# Patient Record
Sex: Female | Born: 1975
Health system: Southern US, Community
[De-identification: ages and names within clinical notes are randomized; demographics above are authoritative.]

## PROBLEM LIST (undated history)

## (undated) DIAGNOSIS — E119 Type 2 diabetes mellitus without complications: Secondary | ICD-10-CM

## (undated) DIAGNOSIS — B001 Herpesviral vesicular dermatitis: Secondary | ICD-10-CM

## (undated) DIAGNOSIS — K219 Gastro-esophageal reflux disease without esophagitis: Secondary | ICD-10-CM

## (undated) DIAGNOSIS — I10 Essential (primary) hypertension: Secondary | ICD-10-CM

## (undated) DIAGNOSIS — L719 Rosacea, unspecified: Secondary | ICD-10-CM

## (undated) HISTORY — DX: Type 2 diabetes mellitus without complications: E11.9

## (undated) HISTORY — DX: Rosacea, unspecified: L71.9

## (undated) HISTORY — DX: Herpesviral vesicular dermatitis: B00.1

## (undated) HISTORY — PX: WISDOM TOOTH EXTRACTION: SHX21

---

## 2002-09-02 ENCOUNTER — Emergency Department (HOSPITAL_COMMUNITY): Admission: EM | Admit: 2002-09-02 | Discharge: 2002-09-02 | Payer: Self-pay | Admitting: Emergency Medicine

## 2008-03-20 ENCOUNTER — Emergency Department (HOSPITAL_COMMUNITY): Admission: EM | Admit: 2008-03-20 | Discharge: 2008-03-20 | Payer: Self-pay | Admitting: Family Medicine

## 2009-03-12 ENCOUNTER — Emergency Department (HOSPITAL_COMMUNITY): Admission: EM | Admit: 2009-03-12 | Discharge: 2009-03-12 | Payer: Self-pay | Admitting: Family Medicine

## 2009-09-17 ENCOUNTER — Emergency Department (HOSPITAL_COMMUNITY): Admission: EM | Admit: 2009-09-17 | Discharge: 2009-09-17 | Payer: Self-pay | Admitting: Physician Assistant

## 2011-11-19 ENCOUNTER — Encounter (HOSPITAL_COMMUNITY): Payer: Self-pay | Admitting: *Deleted

## 2011-11-19 ENCOUNTER — Emergency Department (HOSPITAL_COMMUNITY)
Admission: EM | Admit: 2011-11-19 | Discharge: 2011-11-19 | Disposition: A | Discharge status: Home / Self Care | Payer: 59 | Attending: Emergency Medicine | Admitting: Emergency Medicine

## 2011-11-19 ENCOUNTER — Emergency Department (HOSPITAL_COMMUNITY): Payer: 59

## 2011-11-19 DIAGNOSIS — I10 Essential (primary) hypertension: Secondary | ICD-10-CM | POA: Insufficient documentation

## 2011-11-19 DIAGNOSIS — N8000 Endometriosis of the uterus, unspecified: Secondary | ICD-10-CM | POA: Insufficient documentation

## 2011-11-19 DIAGNOSIS — R109 Unspecified abdominal pain: Secondary | ICD-10-CM

## 2011-11-19 DIAGNOSIS — N859 Noninflammatory disorder of uterus, unspecified: Secondary | ICD-10-CM | POA: Insufficient documentation

## 2011-11-19 DIAGNOSIS — R51 Headache: Secondary | ICD-10-CM | POA: Insufficient documentation

## 2011-11-19 DIAGNOSIS — R55 Syncope and collapse: Secondary | ICD-10-CM | POA: Insufficient documentation

## 2011-11-19 DIAGNOSIS — R1031 Right lower quadrant pain: Secondary | ICD-10-CM | POA: Insufficient documentation

## 2011-11-19 DIAGNOSIS — N898 Other specified noninflammatory disorders of vagina: Secondary | ICD-10-CM | POA: Insufficient documentation

## 2011-11-19 DIAGNOSIS — N858 Other specified noninflammatory disorders of uterus: Secondary | ICD-10-CM

## 2011-11-19 HISTORY — DX: Essential (primary) hypertension: I10

## 2011-11-19 LAB — URINE MICROSCOPIC-ADD ON

## 2011-11-19 LAB — CBC WITH DIFFERENTIAL/PLATELET
Basophils Absolute: 0 10*3/uL (ref 0.0–0.1)
Basophils Relative: 1 % (ref 0–1)
Eosinophils Absolute: 0.1 10*3/uL (ref 0.0–0.7)
Eosinophils Relative: 2 % (ref 0–5)
HCT: 34.8 % — ABNORMAL LOW (ref 36.0–46.0)
Hemoglobin: 11.8 g/dL — ABNORMAL LOW (ref 12.0–15.0)
Lymphocytes Relative: 31 % (ref 12–46)
Lymphs Abs: 1.9 10*3/uL (ref 0.7–4.0)
MCH: 29 pg (ref 26.0–34.0)
MCHC: 33.9 g/dL (ref 30.0–36.0)
MCV: 85.5 fL (ref 78.0–100.0)
Monocytes Absolute: 0.4 10*3/uL (ref 0.1–1.0)
Monocytes Relative: 7 % (ref 3–12)
Neutro Abs: 3.5 10*3/uL (ref 1.7–7.7)
Neutrophils Relative %: 59 % (ref 43–77)
Platelets: 207 10*3/uL (ref 150–400)
RBC: 4.07 MIL/uL (ref 3.87–5.11)
RDW: 13.8 % (ref 11.5–15.5)
WBC: 6 10*3/uL (ref 4.0–10.5)

## 2011-11-19 LAB — URINALYSIS, ROUTINE W REFLEX MICROSCOPIC
Glucose, UA: NEGATIVE mg/dL
Hgb urine dipstick: NEGATIVE
Ketones, ur: 15 mg/dL — AB
Nitrite: NEGATIVE
Protein, ur: 30 mg/dL — AB
Specific Gravity, Urine: 1.031 — ABNORMAL HIGH (ref 1.005–1.030)
Urobilinogen, UA: 1 mg/dL (ref 0.0–1.0)
pH: 7 (ref 5.0–8.0)

## 2011-11-19 LAB — BASIC METABOLIC PANEL
BUN: 14 mg/dL (ref 6–23)
CO2: 25 mEq/L (ref 19–32)
Calcium: 9.4 mg/dL (ref 8.4–10.5)
Chloride: 98 mEq/L (ref 96–112)
Creatinine, Ser: 0.69 mg/dL (ref 0.50–1.10)
GFR calc Af Amer: >90 mL/min (ref >90)
GFR calc non Af Amer: >90 mL/min (ref >90)
Glucose, Bld: 124 mg/dL — ABNORMAL HIGH (ref 70–99)
Potassium: 3.5 mEq/L (ref 3.5–5.1)
Sodium: 135 mEq/L (ref 135–145)

## 2011-11-19 LAB — WET PREP, GENITAL
Clue Cells Wet Prep HPF POC: NONE SEEN
Trich, Wet Prep: NONE SEEN
Yeast Wet Prep HPF POC: NONE SEEN

## 2011-11-19 LAB — HCG, QUANTITATIVE, PREGNANCY: hCG, Beta Chain, Quant, S: 2 m[IU]/mL (ref <5)

## 2011-11-19 LAB — PREGNANCY, URINE: Preg Test, Ur: NEGATIVE

## 2011-11-19 MED ORDER — HYDROCODONE-ACETAMINOPHEN 5-325 MG PO TABS: 1.0000 | ORAL_TABLET | ORAL | Status: AC | PRN

## 2011-11-19 MED ORDER — ONDANSETRON HCL 4 MG/2ML IJ SOLN
4.0000 mg | Freq: Once | INTRAMUSCULAR | Status: AC
  Administered 2011-11-19: 4 mg via INTRAVENOUS
  Filled 2011-11-19: qty 2

## 2011-11-19 MED ORDER — MORPHINE SULFATE 4 MG/ML IJ SOLN
4.0000 mg | Freq: Once | INTRAMUSCULAR | Status: AC
  Administered 2011-11-19: 4 mg via INTRAVENOUS
  Filled 2011-11-19: qty 1

## 2011-11-19 MED ORDER — ONDANSETRON HCL 4 MG PO TABS: 4.0000 mg | ORAL_TABLET | Freq: Three times a day (TID) | ORAL | Status: AC | PRN

## 2011-11-19 NOTE — ED Notes (Signed)
Patient transported to Ultrasound 

## 2011-11-19 NOTE — ED Provider Notes (Signed)
Medical screening examination/treatment/procedure(s) were performed by non-physician practitioner and as supervising physician I was immediately available for consultation/collaboration.   Richardean Canal, MD 11/19/11 1556

## 2011-11-19 NOTE — ED Notes (Addendum)
C/o intermittent RLQ pain radiating through to low back since 0630. Pain worse with mvmt. +nausea, no emesis. Denies diarrhea, urinary frequency, dysuria. States felt faint, dizzy, lightheaded prior to syncopal episode

## 2011-11-19 NOTE — ED Notes (Signed)
C/o sudden onset intermittent RLQ pain since 0630. While here at work had syncopal episode

## 2011-11-19 NOTE — ED Provider Notes (Signed)
History     CSN: [id number]  Arrival date & time 11/19/11  0741   First MD Initiated Contact with Patient 11/19/11 [phone]      Chief Complaint  Patient presents with  . Near Syncope  . Abdominal Pain    (Consider location/radiation/quality/duration/timing/severity/associated sxs/prior treatment) HPI Comments: Patient reports she was getting out of her car to come in to work and upon listing her right leg developed acute onset RLQ abdominal pain, sharp in nature, radiates to the back.  Pain has been constant since onset but also "coming in waves", is worse with movement, better with laying still.  Has taken aleve which she thinks helped a little.  States she was walking into work and started getting lightheaded, her visual field turned white and she started to collapse, caught by a co-worker.  Denies LOC or hitting head.  States she also had pressure in her right calf about the same time.  Denies fevers, N/V, urinary symptoms, change in bowel habits, abnormal vaginal discharge or bleeding.  Denies CP, SOB, palpitations, leg swelling.  LMP Aug 19.  No hx abdominal surgeries.  Pt has never had a blood clot before, no recent hx surgeries, immobilization, travel.  Pt does not smoke, not on any exogenous estrogen.  Mother has had at least two blood clots, one of which was post-surgical, the other pt is not sure of but denies any known clotting disorders.    Patient is a 36 y.o. female presenting with abdominal pain. The history is provided by the patient.  Abdominal Pain The primary symptoms of the illness include abdominal pain. The primary symptoms of the illness do not include fever, nausea, vomiting, diarrhea, dysuria, vaginal discharge or vaginal bleeding.  Additional symptoms associated with the illness include back pain. Symptoms associated with the illness do not include chills, constipation, urgency or frequency.    Past Medical History  Diagnosis Date  . Hypertension     History  reviewed. No pertinent past surgical history.  No family history on file.  History  Substance Use Topics  . Smoking status: Never Smoker   . Smokeless tobacco: Not on file  . Alcohol Use: No    OB History    Grav Para Term Preterm Abortions TAB SAB Ect Mult Living                  Review of Systems  Constitutional: Negative for fever and chills.  Gastrointestinal: Positive for abdominal pain. Negative for nausea, vomiting, diarrhea, constipation and blood in stool.  Genitourinary: Negative for dysuria, urgency, frequency, vaginal bleeding, vaginal discharge and menstrual problem.  Musculoskeletal: Positive for back pain.  Neurological: Positive for headaches. Negative for syncope.    Allergies  Penicillins  Home Medications   Current Outpatient Rx  Name Route Sig Dispense Refill  . NAPROXEN SODIUM 220 MG PO TABS Oral Take 220 mg by mouth once.    Marland Kitchen VALSARTAN-HYDROCHLOROTHIAZIDE 320-25 MG PO TABS Oral Take 1 tablet by mouth daily.      BP 126/85  Pulse 102  Temp 97.7 F (36.5 C) (Oral)  Resp 16  Ht 5\' 8"  (1.727 m)  Wt 201 lb (91.173 kg)  BMI 30.56 kg/m2  SpO2 100%  LMP 10/28/2011  Physical Exam  Nursing note and vitals reviewed. Constitutional: She appears well-developed and well-nourished. No distress.  HENT:  Head: Normocephalic and atraumatic.  Neck: Neck supple.  Cardiovascular: Normal rate and regular rhythm.   Pulmonary/Chest: Effort normal and breath sounds normal. No  respiratory distress. She has no wheezes. She has no rales.  Abdominal: Soft. She exhibits no distension. There is tenderness in the right lower quadrant. There is no rebound, no guarding and no CVA tenderness.  Genitourinary: Uterus is tender. Cervix exhibits no motion tenderness. Right adnexum displays tenderness. Right adnexum displays no mass. Left adnexum displays no mass and no tenderness. No erythema, tenderness or bleeding around the vagina. No foreign body around the vagina. Vaginal  discharge found.  Musculoskeletal:       Cervical back: She exhibits no bony tenderness.       Thoracic back: She exhibits no bony tenderness.       Lumbar back: She exhibits no bony tenderness.  Neurological: She is alert.  Skin: She is not diaphoretic.    ED Course  Procedures (including critical care time)  Labs Reviewed  URINALYSIS, ROUTINE W REFLEX MICROSCOPIC - Abnormal; Notable for the following:    APPearance CLOUDY (*)     Specific Gravity, Urine 1.031 (*)     Bilirubin Urine SMALL (*)     Ketones, ur 15 (*)     Protein, ur 30 (*)     Leukocytes, UA MODERATE (*)     All other components within normal limits  CBC WITH DIFFERENTIAL - Abnormal; Notable for the following:    Hemoglobin 11.8 (*)     HCT 34.8 (*)     All other components within normal limits  BASIC METABOLIC PANEL - Abnormal; Notable for the following:    Glucose, Bld 124 (*)     All other components within normal limits  WET PREP, GENITAL - Abnormal; Notable for the following:    WBC, Wet Prep HPF POC MODERATE (*)     All other components within normal limits  URINE MICROSCOPIC-ADD ON - Abnormal; Notable for the following:    Squamous Epithelial / LPF MANY (*)     Bacteria, UA MANY (*)     Casts GRANULAR CAST (*)  HYALINE CASTS   All other components within normal limits  PREGNANCY, URINE  HCG, QUANTITATIVE, PREGNANCY  GC/CHLAMYDIA PROBE AMP, GENITAL   US Transvaginal Non-ob  11/19/2011  *RADIOLOGY REPORT*  Clinical data:  Abrupt onset of right lower quadrant pain. Evaluate for torsion.  LMP 10/31/2011  TRANSABDOMINAL AND TRANSVAGINAL ULTRASOUND OF PELVIS DOPPLER ULTRASOUND OF OVARIES  Technique:  Both transabdominal and transvaginal ultrasound examinations of the pelvis were performed. Transabdominal technique was performed for global imaging of the pelvis including uterus, ovaries, adnexal regions, and pelvic cul-de-sac.  It was necessary to proceed with endovaginal exam following the transabdominal exam  to visualize the myometrium, endometrium and adnexa.  Color and duplex Doppler ultrasound was utilized to evaluate blood flow to the ovaries.  Comparison:  None.  Findings:  Uterus:  Has a sagittal length of 10.1 cm, depth of 5.6 cm and width of 5.9 cm.  The myometrium appears diffusely heterogeneous with some focal thickening of the anterior myometrium with posterior acoustical shadowing suspicious for underlying adenomyosis.  In the posterior upper uterine segment portion of the uterus there is a small irregular cystic focus identified measuring 7 x 5 x 10 mm. On provided images, it is difficult to tell whether this is immediately subendometrial or is within the endometrial canal. On the cine loop provided, I would favor this to be subendometrial and likely an indication of underlying adenomyosis. Correlation with beta HCG would be recommended to exclude an early intrauterine gestational sac.  Endometrium:  A poor endometrial  myometrial interface is seen and resolution in the fundal portion of the endometrium makes accurate measurement suboptimal.  Right ovary: Measures 3.5 x 2.3 x 2.4 cm and contains a corpus luteum demonstrating peripheral flow.  Color flow evaluation reveals intraovarian flow  Left ovary: Has a normal appearance measuring 2.7 x 2.0 x 2.8 cm Normal appearance/no adnexal mass.  Color flow evaluation reveals intraovarian flow  Pulsed Doppler evaluation demonstrates normal low-resistance arterial and venous waveforms in both ovaries.  IMPRESSION: Heterogeneous myometrium with anterior myometrial shadowing and findings suspicious for a subendometrial cyst and underlying adenomyosis.  As delineation of the fundal portion of the endometrial canal and delineation between the endometrium and subendometrial zones is difficult on this exam, correlation with beta HCG is recommended to exclude an early intrauterine gestational sac causing the small irregular cystic appearance in the fundal portion of the  uterus.  Normal ovaries with no sonographic evidence for ovarian torsion.   Original Report Authenticated By: Bertha Stakes, M.D.    US Pelvis Complete  11/19/2011  *RADIOLOGY REPORT*  Clinical data:  Abrupt onset of right lower quadrant pain. Evaluate for torsion.  LMP 10/31/2011  TRANSABDOMINAL AND TRANSVAGINAL ULTRASOUND OF PELVIS DOPPLER ULTRASOUND OF OVARIES  Technique:  Both transabdominal and transvaginal ultrasound examinations of the pelvis were performed. Transabdominal technique was performed for global imaging of the pelvis including uterus, ovaries, adnexal regions, and pelvic cul-de-sac.  It was necessary to proceed with endovaginal exam following the transabdominal exam to visualize the myometrium, endometrium and adnexa.  Color and duplex Doppler ultrasound was utilized to evaluate blood flow to the ovaries.  Comparison:  None.  Findings:  Uterus:  Has a sagittal length of 10.1 cm, depth of 5.6 cm and width of 5.9 cm.  The myometrium appears diffusely heterogeneous with some focal thickening of the anterior myometrium with posterior acoustical shadowing suspicious for underlying adenomyosis.  In the posterior upper uterine segment portion of the uterus there is a small irregular cystic focus identified measuring 7 x 5 x 10 mm. On provided images, it is difficult to tell whether this is immediately subendometrial or is within the endometrial canal. On the cine loop provided, I would favor this to be subendometrial and likely an indication of underlying adenomyosis. Correlation with beta HCG would be recommended to exclude an early intrauterine gestational sac.  Endometrium:  A poor endometrial myometrial interface is seen and resolution in the fundal portion of the endometrium makes accurate measurement suboptimal.  Right ovary: Measures 3.5 x 2.3 x 2.4 cm and contains a corpus luteum demonstrating peripheral flow.  Color flow evaluation reveals intraovarian flow  Left ovary: Has a normal  appearance measuring 2.7 x 2.0 x 2.8 cm Normal appearance/no adnexal mass.  Color flow evaluation reveals intraovarian flow  Pulsed Doppler evaluation demonstrates normal low-resistance arterial and venous waveforms in both ovaries.  IMPRESSION: Heterogeneous myometrium with anterior myometrial shadowing and findings suspicious for a subendometrial cyst and underlying adenomyosis.  As delineation of the fundal portion of the endometrial canal and delineation between the endometrium and subendometrial zones is difficult on this exam, correlation with beta HCG is recommended to exclude an early intrauterine gestational sac causing the small irregular cystic appearance in the fundal portion of the uterus.  Normal ovaries with no sonographic evidence for ovarian torsion.   Original Report Authenticated By: Bertha Stakes, M.D.    [place] Art/ven Flow Abd Pelv Doppler  11/19/2011  *RADIOLOGY REPORT*  Clinical data:  Abrupt onset of right lower  quadrant pain. Evaluate for torsion.  LMP 10/31/2011  TRANSABDOMINAL AND TRANSVAGINAL ULTRASOUND OF PELVIS DOPPLER ULTRASOUND OF OVARIES  Technique:  Both transabdominal and transvaginal ultrasound examinations of the pelvis were performed. Transabdominal technique was performed for global imaging of the pelvis including uterus, ovaries, adnexal regions, and pelvic cul-de-sac.  It was necessary to proceed with endovaginal exam following the transabdominal exam to visualize the myometrium, endometrium and adnexa.  Color and duplex Doppler ultrasound was utilized to evaluate blood flow to the ovaries.  Comparison:  None.  Findings:  Uterus:  Has a sagittal length of 10.1 cm, depth of 5.6 cm and width of 5.9 cm.  The myometrium appears diffusely heterogeneous with some focal thickening of the anterior myometrium with posterior acoustical shadowing suspicious for underlying adenomyosis.  In the posterior upper uterine segment portion of the uterus there is a small irregular cystic  focus identified measuring 7 x 5 x 10 mm. On provided images, it is difficult to tell whether this is immediately subendometrial or is within the endometrial canal. On the cine loop provided, I would favor this to be subendometrial and likely an indication of underlying adenomyosis. Correlation with beta HCG would be recommended to exclude an early intrauterine gestational sac.  Endometrium:  A poor endometrial myometrial interface is seen and resolution in the fundal portion of the endometrium makes accurate measurement suboptimal.  Right ovary: Measures 3.5 x 2.3 x 2.4 cm and contains a corpus luteum demonstrating peripheral flow.  Color flow evaluation reveals intraovarian flow  Left ovary: Has a normal appearance measuring 2.7 x 2.0 x 2.8 cm Normal appearance/no adnexal mass.  Color flow evaluation reveals intraovarian flow  Pulsed Doppler evaluation demonstrates normal low-resistance arterial and venous waveforms in both ovaries.  IMPRESSION: Heterogeneous myometrium with anterior myometrial shadowing and findings suspicious for a subendometrial cyst and underlying adenomyosis.  As delineation of the fundal portion of the endometrial canal and delineation between the endometrium and subendometrial zones is difficult on this exam, correlation with beta HCG is recommended to exclude an early intrauterine gestational sac causing the small irregular cystic appearance in the fundal portion of the uterus.  Normal ovaries with no sonographic evidence for ovarian torsion.   Original Report Authenticated By: Bertha Stakes, M.D.     9:18 AM Discussed patient with Dr Silverio Lay.    11:55 AM Reexamined patient's abdomen.  Abdomen is soft, only mildly ttp RLQ, no guarding, no rebound. I discussed results with Dr Silverio Lay who has also reviewed the [place] and lab results.  Plan is for HCG quant to ensure pregnancy test is negative.  If negative, pt to be d/c home.    1:38 PM HCG quant was ordered, cancelled by someone, and  reordered by me.  I have been in contact with the lab.  I have also just asked the nurse to ensure that this has made it to the lab.    2:03 PM Patient reports her pain is only 1/10.  Discussed results with patient and significant other.  Plan is for d/c.     Date: 11/19/2011  Rate: 95  Rhythm: normal sinus rhythm  QRS Axis: normal  Intervals: normal  ST/T Wave abnormalities: normal  Conduction Disutrbances: none  Narrative Interpretation:   Old EKG Reviewed: none available   1. Abdominal pain   2. Adenomyosis   3. Uterine cyst       MDM  Pt with acute onset RLQ pain.  Pt with midline and right adnexal tenderness on pelvic  -  US shows "Heterogeneous myometrium with anterior myometrial shadowing and findings suspicious for a subendometrial cyst and underlying adenomyosis"  - HCG quant is negative.  Ovaries are normal with normal doppler flow.  After pain and nausea medications, patient became much more comfortable, did not require maintenance dosing.  Repeat exam is improved, nonsurgical.  Labs show only mild anemia.  Urine sample with many epithelial cells, only 3-6 WBC.  Given acute onset, doubt UTI as cause of pain.  Also given story, lack of fever or elevation in WBC, doubt appendicitis.  Pt did have pain as she raised her leg to get out of car, possibly musculoskeletal, but I believe the pain was more severe than this - however, it was worse with movement.  Pt noted mild tenderness of right calf though patient is very low risk for DVT and has no exam findings consistent with DVT.  Pt d/c home with norco and zofran, though patient's symptoms were relieved in ED - pt strongly advised to return for any worsening symptoms and not attempt to "cover it up" with pain medication.  Discussed all results with patient.  Pt given return precautions.  Pt verbalizes understanding and agrees with plan.           [place], [place] 11/19/11 1520

## 2011-11-19 NOTE — ED Notes (Signed)
Lab states that they received pts HCG quantitative  at 1354.

## 2011-11-20 LAB — GC/CHLAMYDIA PROBE AMP, GENITAL
Chlamydia, DNA Probe: NEGATIVE
GC Probe Amp, Genital: NEGATIVE

## 2012-02-12 ENCOUNTER — Encounter (HOSPITAL_COMMUNITY): Payer: Self-pay | Admitting: Pharmacist

## 2012-02-15 NOTE — H&P (Signed)
Jaime Rhodes  DICTATION # [id number] CSN# [id number]   Meriel Pica, MD 02/15/2012 9:45 AM

## 2012-02-16 ENCOUNTER — Encounter (HOSPITAL_COMMUNITY)
Admission: RE | Admit: 2012-02-16 | Discharge: 2012-02-16 | Disposition: A | Discharge status: Home / Self Care | Payer: 59 | Source: Ambulatory Visit | Attending: Obstetrics and Gynecology | Admitting: Obstetrics and Gynecology

## 2012-02-16 ENCOUNTER — Encounter (HOSPITAL_COMMUNITY): Payer: Self-pay

## 2012-02-16 HISTORY — DX: Gastro-esophageal reflux disease without esophagitis: K21.9

## 2012-02-16 LAB — CBC
HCT: 36.9 % (ref 36.0–46.0)
Hemoglobin: 12.2 g/dL (ref 12.0–15.0)
MCH: 28.2 pg (ref 26.0–34.0)
MCHC: 33.1 g/dL (ref 30.0–36.0)
MCV: 85.2 fL (ref 78.0–100.0)
Platelets: 229 10*3/uL (ref 150–400)
RBC: 4.33 MIL/uL (ref 3.87–5.11)
RDW: 13.6 % (ref 11.5–15.5)
WBC: 7.7 10*3/uL (ref 4.0–10.5)

## 2012-02-16 LAB — BASIC METABOLIC PANEL
BUN: 12 mg/dL (ref 6–23)
CO2: 26 mEq/L (ref 19–32)
Calcium: 9.4 mg/dL (ref 8.4–10.5)
Chloride: 99 mEq/L (ref 96–112)
Creatinine, Ser: 0.71 mg/dL (ref 0.50–1.10)
GFR calc Af Amer: >90 mL/min (ref >90)
GFR calc non Af Amer: >90 mL/min (ref >90)
Glucose, Bld: 123 mg/dL — ABNORMAL HIGH (ref 70–99)
Potassium: 3.6 mEq/L (ref 3.5–5.1)
Sodium: 137 mEq/L (ref 135–145)

## 2012-02-16 LAB — SURGICAL PCR SCREEN
MRSA, PCR: NEGATIVE
Staphylococcus aureus: NEGATIVE

## 2012-02-16 NOTE — Patient Instructions (Addendum)
   Your procedure is scheduled ZO:[id number] December 10th  Enter through the Main Entrance of [facility] at:6am Pick up the phone at the desk and dial [phone] and inform us of your arrival.  Please call this number if you have any problems the morning of surgery: [phone]  Remember: Do not eat or drink anything after midnight on Monday Please take your blood pressure medicine and nexium morning of surgery with sips of water  Do not wear jewelry, make-up, or FINGER nail polish No metal in your hair or on your body. Do not wear lotions, powders, perfumes. You may wear deodorant.  Please use your CHG wash as directed prior to surgery.  Do not shave anywhere for at least 12 hours prior to first CHG shower.  Do not bring valuables to the hospital.   Leave suitcase in the car. After Surgery it may be brought to your room. For patients being admitted to the hospital, checkout time is 11:00am the day of discharge.

## 2012-02-16 NOTE — H&P (Signed)
NAMEELAJAH, Jaime Rhodes              ACCOUNT NO.:  [id number]  MEDICAL RECORD NO.:  [id number]  LOCATION:                                 FACILITY:  PHYSICIAN:  Duke Salvia. Marcelle Overlie, M.D.DATE OF BIRTH:  1975-05-01  DATE OF ADMISSION: DATE OF DISCHARGE:                             HISTORY & PHYSICAL   CHIEF COMPLAINT:  Pelvic pain, dyspareunia, menorrhagia.  HISTORY OF PRESENT ILLNESS:  A 36 year old, G2, P2, her husband has had a vasectomy.  She has had two vaginal deliveries, she had been evaluated by her PCP, [name] for pelvic pain, dyspareunia and menorrhagia.  Ultrasound by [name] on November 19, 2011, showed heterogeneous myometrium with anterior myometrial shattering, possible underlying adenomyosis.  Adnexa negative.  Adnexa were unremarkable on that evaluation.  Her last Pap was October 2012, which was normal. Hemoglobin 11.1.  Due to the severity of her menorrhagia and pain, she would prefer more definitive therapy.  We had discussed other options perhaps hysteroscopy, D and C, Mirena IUD or ablation, but because of her cramping and the menorrhagia, she prefers LAVH.  This procedure including risks related to bleeding, infection, transfusion, adjacent to organ injury, possible need to complete the surgery by open technique along with her expected recovery time discussed with her, which she understands and accepts.  PAST MEDICAL HISTORY:  ALLERGIES:  PENICILLIN.  OBSTETRICAL HISTORY:  Two vaginal deliveries in 1995 and 2001.  REVIEW OF SYSTEMS:  Significant for prior history of migraine headache, hypertension.  CURRENT MEDICATIONS:  Valsartan 320/25 once daily.  FAMILY HISTORY:  Significant for heart disease, asthma, kidney disease, UTI, osteoporosis, arthritis, diabetes, hypertension, and breast, prostate and stomach cancer.  SOCIAL HISTORY:  Denies alcohol, tobacco, or drug use.  [name] is her PCP.  PHYSICAL EXAMINATION:  VITAL SIGNS:   Temperature 98.2, blood pressure 132/100. HEENT:  Unremarkable. NECK:  Supple without masses. LUNGS:  Clear. CARDIOVASCULAR:  Regular rate and rhythm without murmurs, rubs, or gallops. BREASTS:  Without masses. ABDOMEN:  Soft, flat, and nontender. PELVIC:  Normal external genitalia.  Vagina and cervix are clear. Uterus is 7-8-week size, midposition, mobile, adnexa negative. EXTREMITIES:  Unremarkable. NEUROLOGIC:  Unremarkable.  IMPRESSION:  Menorrhagia with dyspareunia and pelvic pain, possible adenomyosis.  PLAN:  LAVH.  Procedure and risks discussed as above.     Jaime Rhodes M. Marcelle Overlie, M.D.     [facility]/MEDQ  D:  02/15/2012  T:  02/15/2012  Job:  [id number]

## 2012-02-21 MED ORDER — GENTAMICIN SULFATE 40 MG/ML IJ SOLN
INTRAVENOUS | Status: AC
  Administered 2012-02-22: 100 mL via INTRAVENOUS
  Filled 2012-02-21: qty 9.5

## 2012-02-22 ENCOUNTER — Encounter (HOSPITAL_COMMUNITY): Payer: Self-pay | Admitting: *Deleted

## 2012-02-22 ENCOUNTER — Encounter (HOSPITAL_COMMUNITY)
Admission: RE | Disposition: A | Discharge status: Home / Self Care | Payer: Self-pay | Source: Ambulatory Visit | Attending: Obstetrics and Gynecology

## 2012-02-22 ENCOUNTER — Ambulatory Visit (HOSPITAL_COMMUNITY): Payer: 59 | Admitting: Anesthesiology

## 2012-02-22 ENCOUNTER — Encounter (HOSPITAL_COMMUNITY): Payer: Self-pay | Admitting: Anesthesiology

## 2012-02-22 ENCOUNTER — Ambulatory Visit (HOSPITAL_COMMUNITY)
Admission: RE | Admit: 2012-02-22 | Discharge: 2012-02-23 | Disposition: A | Discharge status: Home / Self Care | Payer: 59 | Source: Ambulatory Visit | Attending: Obstetrics and Gynecology | Admitting: Obstetrics and Gynecology

## 2012-02-22 DIAGNOSIS — IMO0002 Reserved for concepts with insufficient information to code with codable children: Secondary | ICD-10-CM | POA: Insufficient documentation

## 2012-02-22 DIAGNOSIS — N949 Unspecified condition associated with female genital organs and menstrual cycle: Secondary | ICD-10-CM | POA: Insufficient documentation

## 2012-02-22 DIAGNOSIS — N8000 Endometriosis of the uterus, unspecified: Secondary | ICD-10-CM | POA: Insufficient documentation

## 2012-02-22 HISTORY — PX: LAPAROSCOPIC ASSISTED VAGINAL HYSTERECTOMY: SHX5398

## 2012-02-22 LAB — PREGNANCY, URINE: Preg Test, Ur: NEGATIVE

## 2012-02-22 SURGERY — HYSTERECTOMY, VAGINAL, LAPAROSCOPY-ASSISTED
Anesthesia: General | Site: Abdomen | Wound class: Clean Contaminated

## 2012-02-22 MED ORDER — HYDROMORPHONE HCL PF 1 MG/ML IJ SOLN
INTRAMUSCULAR | Status: AC
  Administered 2012-02-22: 0.5 mg via INTRAVENOUS
  Filled 2012-02-22: qty 1

## 2012-02-22 MED ORDER — IBUPROFEN 800 MG PO TABS
800.0000 mg | ORAL_TABLET | Freq: Three times a day (TID) | ORAL | Status: DC | PRN
  Administered 2012-02-23: 800 mg via ORAL
  Filled 2012-02-22: qty 1

## 2012-02-22 MED ORDER — KETOROLAC TROMETHAMINE 30 MG/ML IJ SOLN
INTRAMUSCULAR | Status: AC
  Filled 2012-02-22: qty 1

## 2012-02-22 MED ORDER — ONDANSETRON HCL 4 MG/2ML IJ SOLN
INTRAMUSCULAR | Status: AC
  Filled 2012-02-22: qty 2

## 2012-02-22 MED ORDER — VALSARTAN-HYDROCHLOROTHIAZIDE 320-25 MG PO TABS: 1.0000 | ORAL_TABLET | Freq: Every day | ORAL | Status: DC

## 2012-02-22 MED ORDER — LIDOCAINE HCL (CARDIAC) 20 MG/ML IV SOLN
INTRAVENOUS | Status: AC
  Filled 2012-02-22: qty 5

## 2012-02-22 MED ORDER — LACTATED RINGERS IV SOLN
INTRAVENOUS | Status: DC
  Administered 2012-02-22 (×3): via INTRAVENOUS

## 2012-02-22 MED ORDER — KETOROLAC TROMETHAMINE 30 MG/ML IJ SOLN
INTRAMUSCULAR | Status: DC | PRN
Start: 2012-02-22 — End: 2012-02-22
  Administered 2012-02-22: 60 mg via INTRAVENOUS

## 2012-02-22 MED ORDER — NEOSTIGMINE METHYLSULFATE 1 MG/ML IJ SOLN
INTRAMUSCULAR | Status: AC
  Filled 2012-02-22: qty 10

## 2012-02-22 MED ORDER — NEOSTIGMINE METHYLSULFATE 1 MG/ML IJ SOLN
INTRAMUSCULAR | Status: DC | PRN
  Administered 2012-02-22: 2 mg via INTRAVENOUS

## 2012-02-22 MED ORDER — BUPIVACAINE HCL (PF) 0.25 % IJ SOLN
INTRAMUSCULAR | Status: AC
  Filled 2012-02-22: qty 30

## 2012-02-22 MED ORDER — ZOLPIDEM TARTRATE 5 MG PO TABS: 5.0000 mg | ORAL_TABLET | Freq: Every evening | ORAL | Status: DC | PRN

## 2012-02-22 MED ORDER — HYDROCHLOROTHIAZIDE 25 MG PO TABS
25.0000 mg | ORAL_TABLET | Freq: Every day | ORAL | Status: DC
  Administered 2012-02-23: 25 mg via ORAL
  Filled 2012-02-22 (×2): qty 1

## 2012-02-22 MED ORDER — DIPHENHYDRAMINE HCL 12.5 MG/5ML PO ELIX: 12.5000 mg | ORAL_SOLUTION | Freq: Four times a day (QID) | ORAL | Status: DC | PRN

## 2012-02-22 MED ORDER — GLYCOPYRROLATE 0.2 MG/ML IJ SOLN
INTRAMUSCULAR | Status: DC | PRN
  Administered 2012-02-22: 0.4 mg via INTRAVENOUS

## 2012-02-22 MED ORDER — PANTOPRAZOLE SODIUM 40 MG PO TBEC
80.0000 mg | DELAYED_RELEASE_TABLET | Freq: Every day | ORAL | Status: DC
  Filled 2012-02-22 (×2): qty 2

## 2012-02-22 MED ORDER — BUPIVACAINE HCL (PF) 0.25 % IJ SOLN
INTRAMUSCULAR | Status: DC | PRN
  Administered 2012-02-22: 9 mL

## 2012-02-22 MED ORDER — SODIUM CHLORIDE 0.9 % IJ SOLN: 9.0000 mL | INTRAMUSCULAR | Status: DC | PRN

## 2012-02-22 MED ORDER — KETOROLAC TROMETHAMINE 30 MG/ML IJ SOLN
30.0000 mg | Freq: Four times a day (QID) | INTRAMUSCULAR | Status: DC
  Filled 2012-02-22: qty 1

## 2012-02-22 MED ORDER — PROPOFOL 10 MG/ML IV EMUL
INTRAVENOUS | Status: DC | PRN
  Administered 2012-02-22: 200 mg via INTRAVENOUS

## 2012-02-22 MED ORDER — PHENYLEPHRINE HCL 10 MG/ML IJ SOLN
INTRAMUSCULAR | Status: DC | PRN
  Administered 2012-02-22 (×2): 40 ug via INTRAVENOUS
  Administered 2012-02-22: 80 ug via INTRAVENOUS

## 2012-02-22 MED ORDER — IRBESARTAN 300 MG PO TABS
300.0000 mg | ORAL_TABLET | Freq: Every day | ORAL | Status: DC
  Administered 2012-02-23: 300 mg via ORAL
  Filled 2012-02-22 (×2): qty 1

## 2012-02-22 MED ORDER — FENTANYL CITRATE 0.05 MG/ML IJ SOLN
INTRAMUSCULAR | Status: DC | PRN
  Administered 2012-02-22: 50 ug via INTRAVENOUS
  Administered 2012-02-22: 100 ug via INTRAVENOUS

## 2012-02-22 MED ORDER — ONDANSETRON HCL 4 MG/2ML IJ SOLN: 4.0000 mg | Freq: Four times a day (QID) | INTRAMUSCULAR | Status: DC | PRN

## 2012-02-22 MED ORDER — DEXAMETHASONE SODIUM PHOSPHATE 4 MG/ML IJ SOLN
INTRAMUSCULAR | Status: DC | PRN
  Administered 2012-02-22: 10 mg via INTRAVENOUS

## 2012-02-22 MED ORDER — HYDROMORPHONE HCL PF 1 MG/ML IJ SOLN
0.2500 mg | INTRAMUSCULAR | Status: DC | PRN
  Administered 2012-02-22 (×2): 0.5 mg via INTRAVENOUS

## 2012-02-22 MED ORDER — NALOXONE HCL 0.4 MG/ML IJ SOLN: 0.4000 mg | INTRAMUSCULAR | Status: DC | PRN

## 2012-02-22 MED ORDER — SCOPOLAMINE 1 MG/3DAYS TD PT72
MEDICATED_PATCH | TRANSDERMAL | Status: AC
  Administered 2012-02-22: 1.5 mg
  Filled 2012-02-22: qty 1

## 2012-02-22 MED ORDER — BUTORPHANOL TARTRATE 1 MG/ML IJ SOLN: 1.0000 mg | INTRAMUSCULAR | Status: DC | PRN

## 2012-02-22 MED ORDER — EPHEDRINE SULFATE 50 MG/ML IJ SOLN
INTRAMUSCULAR | Status: DC | PRN
  Administered 2012-02-22 (×2): 5 mg via INTRAVENOUS

## 2012-02-22 MED ORDER — DIPHENHYDRAMINE HCL 50 MG/ML IJ SOLN: 12.5000 mg | Freq: Four times a day (QID) | INTRAMUSCULAR | Status: DC | PRN

## 2012-02-22 MED ORDER — FENTANYL CITRATE 0.05 MG/ML IJ SOLN
INTRAMUSCULAR | Status: AC
  Filled 2012-02-22: qty 5

## 2012-02-22 MED ORDER — MIDAZOLAM HCL 5 MG/5ML IJ SOLN
INTRAMUSCULAR | Status: DC | PRN
  Administered 2012-02-22: 2 mg via INTRAVENOUS

## 2012-02-22 MED ORDER — SODIUM CHLORIDE 0.9 % IJ SOLN
INTRAMUSCULAR | Status: DC | PRN
  Administered 2012-02-22: 10 mL

## 2012-02-22 MED ORDER — KETOROLAC TROMETHAMINE 30 MG/ML IJ SOLN
30.0000 mg | Freq: Four times a day (QID) | INTRAMUSCULAR | Status: DC
  Administered 2012-02-22 – 2012-02-23 (×4): 30 mg via INTRAVENOUS
  Filled 2012-02-22 (×3): qty 1

## 2012-02-22 MED ORDER — PROPOFOL 10 MG/ML IV EMUL
INTRAVENOUS | Status: AC
  Filled 2012-02-22: qty 20

## 2012-02-22 MED ORDER — GLYCOPYRROLATE 0.2 MG/ML IJ SOLN
INTRAMUSCULAR | Status: AC
  Filled 2012-02-22: qty 2

## 2012-02-22 MED ORDER — LIDOCAINE HCL (CARDIAC) 20 MG/ML IV SOLN
INTRAVENOUS | Status: DC | PRN
  Administered 2012-02-22: 80 mg via INTRAVENOUS

## 2012-02-22 MED ORDER — DEXAMETHASONE SODIUM PHOSPHATE 10 MG/ML IJ SOLN
INTRAMUSCULAR | Status: AC
  Filled 2012-02-22: qty 1

## 2012-02-22 MED ORDER — OXYCODONE-ACETAMINOPHEN 5-325 MG PO TABS
1.0000 | ORAL_TABLET | ORAL | Status: DC | PRN
  Administered 2012-02-23: 1 via ORAL
  Filled 2012-02-22: qty 1

## 2012-02-22 MED ORDER — ONDANSETRON HCL 4 MG PO TABS: 4.0000 mg | ORAL_TABLET | Freq: Four times a day (QID) | ORAL | Status: DC | PRN

## 2012-02-22 MED ORDER — KETOROLAC TROMETHAMINE 30 MG/ML IJ SOLN: 30.0000 mg | Freq: Once | INTRAMUSCULAR | Status: DC

## 2012-02-22 MED ORDER — ROCURONIUM BROMIDE 100 MG/10ML IV SOLN
INTRAVENOUS | Status: DC | PRN
  Administered 2012-02-22: 50 mg via INTRAVENOUS

## 2012-02-22 MED ORDER — ONDANSETRON HCL 4 MG/2ML IJ SOLN
INTRAMUSCULAR | Status: DC | PRN
  Administered 2012-02-22: 4 mg via INTRAVENOUS

## 2012-02-22 MED ORDER — ROCURONIUM BROMIDE 50 MG/5ML IV SOLN
INTRAVENOUS | Status: AC
  Filled 2012-02-22: qty 1

## 2012-02-22 MED ORDER — MIDAZOLAM HCL 2 MG/2ML IJ SOLN
INTRAMUSCULAR | Status: AC
  Filled 2012-02-22: qty 2

## 2012-02-22 MED ORDER — MORPHINE SULFATE (PF) 1 MG/ML IV SOLN
INTRAVENOUS | Status: DC
  Administered 2012-02-22: 10:00:00 via INTRAVENOUS
  Administered 2012-02-22: 5 mg via INTRAVENOUS
  Administered 2012-02-22: 4 mg via INTRAVENOUS
  Administered 2012-02-23: 1 mg via INTRAVENOUS
  Administered 2012-02-23: 2 mg via INTRAVENOUS
  Filled 2012-02-22: qty 25

## 2012-02-22 MED ORDER — DEXTROSE IN LACTATED RINGERS 5 % IV SOLN
INTRAVENOUS | Status: DC
  Administered 2012-02-22 – 2012-02-23 (×3): via INTRAVENOUS

## 2012-02-22 MED ORDER — MENTHOL 3 MG MT LOZG: 1.0000 | LOZENGE | OROMUCOSAL | Status: DC | PRN

## 2012-02-22 SURGICAL SUPPLY — 33 items
CATH ROBINSON RED A/P 16FR (CATHETERS) ×2 IMPLANT
CLOTH BEACON ORANGE TIMEOUT ST (SAFETY) ×2 IMPLANT
CONT PATH 16OZ SNAP LID 3702 (MISCELLANEOUS) ×2 IMPLANT
COVER TABLE BACK 60X90 (DRAPES) ×2 IMPLANT
DECANTER SPIKE VIAL GLASS SM (MISCELLANEOUS) ×2 IMPLANT
DERMABOND ADVANCED (GAUZE/BANDAGES/DRESSINGS) ×1
DERMABOND ADVANCED .7 DNX12 (GAUZE/BANDAGES/DRESSINGS) ×1 IMPLANT
ELECT LIGASURE LONG (ELECTRODE) ×2 IMPLANT
ELECT REM PT RETURN 9FT ADLT (ELECTROSURGICAL) ×2
ELECTRODE REM PT RTRN 9FT ADLT (ELECTROSURGICAL) ×1 IMPLANT
GLOVE BIO SURGEON STRL SZ7 (GLOVE) ×6 IMPLANT
GLOVE BIOGEL PI IND STRL 6.5 (GLOVE) ×1 IMPLANT
GLOVE BIOGEL PI INDICATOR 6.5 (GLOVE) ×1
GOWN BRE IMP SLV AUR LG STRL (GOWN DISPOSABLE) ×4 IMPLANT
GOWN STRL REIN XL XLG (GOWN DISPOSABLE) ×4 IMPLANT
NEEDLE INSUFFLATION 14GA 120MM (NEEDLE) ×2 IMPLANT
NS IRRIG 1000ML POUR BTL (IV SOLUTION) ×2 IMPLANT
PACK LAVH (CUSTOM PROCEDURE TRAY) ×2 IMPLANT
PROTECTOR NERVE ULNAR (MISCELLANEOUS) ×2 IMPLANT
SEALER TISSUE G2 CVD JAW 45CM (ENDOMECHANICALS) ×4 IMPLANT
SET IRRIG TUBING LAPAROSCOPIC (IRRIGATION / IRRIGATOR) IMPLANT
SUT MON AB 2-0 CT1 36 (SUTURE) ×4 IMPLANT
SUT VIC AB 0 CT1 18XCR BRD8 (SUTURE) ×1 IMPLANT
SUT VIC AB 0 CT1 36 (SUTURE) ×2 IMPLANT
SUT VIC AB 0 CT1 8-18 (SUTURE) ×1
SUT VICRYL 0 TIES 12 18 (SUTURE) ×2 IMPLANT
SUT VICRYL 4-0 PS2 18IN ABS (SUTURE) ×2 IMPLANT
TOWEL OR 17X24 6PK STRL BLUE (TOWEL DISPOSABLE) ×6 IMPLANT
TRAY FOLEY CATH 14FR (SET/KITS/TRAYS/PACK) ×2 IMPLANT
TROCAR Z-THREAD BLADED 11X100M (TROCAR) ×2 IMPLANT
TROCAR Z-THREAD BLADED 5X100MM (TROCAR) ×2 IMPLANT
WARMER LAPAROSCOPE (MISCELLANEOUS) ×2 IMPLANT
WATER STERILE IRR 1000ML POUR (IV SOLUTION) ×2 IMPLANT

## 2012-02-22 NOTE — Addendum Note (Signed)
Addendum  created 02/22/12 1802 by Algis Greenhouse, CRNA   Modules edited:Notes Section

## 2012-02-22 NOTE — Op Note (Signed)
Preoperative diagnosis: Pelvic pain dyspareunia, probable adenomyosis  Postoperative diagnosis: Same  Procedure: LAVH  Surgeon: Marcelle Overlie  Assistant: Tomblin  EBL: 150 cc  Specimens removed: Uterus, to pathology  Procedure and findings:  Patient taken the operating room after an adequate level of general anesthesia was obtained with the legs in stirrups the abdomen perineum and vagina were prepped and draped in usual fashion for LAVH. The bladder was drained EUA carried out the uterus was upper limit of normal size mobile adnexa negative. Appropriate timeout for taken at that point. Weighted speculum was positioned and a Hulka was applied to the uterus for manipulation.  Attention directed to the abdomen the subumbilical area was infiltrated with quarter percent Marcaine plain small incision was made in the varies needle was introduced without difficulty. Its intra-abdominal position was verified by pressure water testing. After a 3 L pneumoperitoneum syncopated lap scopic trocar and sleeve were then introduced that difficulty. There was no evidence of any bleeding or trauma. 3 finger breaths above the symphysis in the midline a 5 mm trocar was inserted under direct visualization. The patient was then placed in Trendelenburg and the pelvic findings as follows  Uterus itself was symmetrically large 6-7 weeks size globular at the fundus adnexa unremarkable cul-de-sac free and clear the appendix and upper abdomen were normal using the in seal device the utero-ovarian pedicle on the right was coagulated and divided down to and including the round ligament the same on the opposite side thus conserving both ovaries. These areas were hemostatic attention directed to the vaginal portion the procedure at this point.  Legs were extended weighted speculum was positioned cervix grasped with tenaculum the cervical vaginal mucosa was incised posterior culdotomy performed without difficulty the bladder was  advanced superiorly with sharp and blunt dissection the anterior peritoneal reflection was then identified entered, and a retractor used to gently elevate the bladder out of the field. In sequential manner, staying close to the uterus, the LigaSure device was then used to coagulated and divided the uterosacral ligament cardinal ligament uterine vasculature pedicles and upper broad ligament pedicles. The fundus of the uterus is in delivered posteriorly remaining pedicles were coagulated and divided. The vaginal cuff was then closed from 3 to 9:00 with a running locked 2-0 Vicryl suture. Prior to closure sponge denies precast reported as correct x2 vaginal mucosa was then closed right to left with interrupted 2-0 Monocryl sutures this was hemostatic Foley catheter was in position draining clear urine. Repeat laparoscopy revealed hemostasis of the operative sites even at reduced pressure and stress removed gas less escape because closed with 4-0 Vicryl subcuticular at the umbilicus and Dermabond in the lower incision she tolerated this well went to recovery room in good condition.  Dictated with [facility]  Telesia Ates M. Milana Obey.D.

## 2012-02-22 NOTE — Preoperative (Signed)
Beta Blockers   Reason not to administer Beta Blockers:Not Applicable 

## 2012-02-22 NOTE — Anesthesia Postprocedure Evaluation (Signed)
Anesthesia Post Note  Patient: Jaime Rhodes  Procedure(s) Performed: Procedure(s) (LRB): LAPAROSCOPIC ASSISTED VAGINAL HYSTERECTOMY (N/A)  Anesthesia type: General  Patient location: PACU  Post pain: Pain level controlled  Post assessment: Post-op Vital signs reviewed  Last Vitals:  Filed Vitals:   02/22/12 1000  BP: 131/79  Pulse: 107  Temp:   Resp: 12    Post vital signs: Reviewed  Level of consciousness: sedated  Complications: No apparent anesthesia complications

## 2012-02-22 NOTE — Anesthesia Postprocedure Evaluation (Signed)
  Anesthesia Post-op Note  Patient: Jaime Rhodes  Procedure(s) Performed: Procedure(s) (LRB) with comments: LAPAROSCOPIC ASSISTED VAGINAL HYSTERECTOMY (N/A)  Patient Location: [facility]  Anesthesia Type:General  Level of Consciousness: sedated  Airway and Oxygen Therapy: Patient Spontanous Breathing and Patient connected to nasal cannula oxygen  Post-op Pain: mild  Post-op Assessment: Post-op Vital signs reviewed  Post-op Vital Signs: Reviewed and stable  Complications: No apparent anesthesia complications

## 2012-02-22 NOTE — Progress Notes (Signed)
The patient was re-examined with no change in status 

## 2012-02-22 NOTE — Anesthesia Preprocedure Evaluation (Addendum)
Anesthesia Evaluation  Patient identified by MRN, date of birth, ID band Patient awake    Reviewed: Allergy & Precautions, H&P , NPO status , Patient's Chart, lab work & pertinent test results  Airway Mallampati: III TM Distance: >3 FB Neck ROM: Full    Dental No notable dental hx. (+) Teeth Intact and Caps   Pulmonary neg pulmonary ROS,  breath sounds clear to auscultation  Pulmonary exam normal       Cardiovascular hypertension, Pt. on medications Rhythm:Regular Rate:Normal     Neuro/Psych negative neurological ROS  negative psych ROS   GI/Hepatic Neg liver ROS, GERD-  Medicated and Controlled,  Endo/Other  negative endocrine ROS  Renal/GU negative Renal ROS  negative genitourinary   Musculoskeletal negative musculoskeletal ROS (+)   Abdominal (+) + obese,  Abdomen: soft.    Peds  Hematology negative hematology ROS (+)   Anesthesia Other Findings   Reproductive/Obstetrics negative OB ROS Pelvic Pain Menorrhagia                          Anesthesia Physical Anesthesia Plan  ASA: II  Anesthesia Plan: General   Post-op Pain Management:    Induction: Intravenous  Airway Management Planned: Oral ETT  Additional Equipment:   Intra-op Plan:   Post-operative Plan: Extubation in OR  Informed Consent: I have reviewed the patients History and Physical, chart, labs and discussed the procedure including the risks, benefits and alternatives for the proposed anesthesia with the patient or authorized representative who has indicated his/her understanding and acceptance.   Dental advisory given  Plan Discussed with: CRNA, Anesthesiologist and Surgeon  Anesthesia Plan Comments:         Anesthesia Quick Evaluation

## 2012-02-22 NOTE — Transfer of Care (Signed)
Immediate Anesthesia Transfer of Care Note  Patient: Jaime Rhodes  Procedure(s) Performed: Procedure(s) (LRB) with comments: LAPAROSCOPIC ASSISTED VAGINAL HYSTERECTOMY (N/A)  Patient Location: PACU  Anesthesia Type:General  Level of Consciousness: awake, oriented and patient cooperative  Airway & Oxygen Therapy: Patient Spontanous Breathing and Patient connected to nasal cannula oxygen  Post-op Assessment: Report given to PACU RN and Post -op Vital signs reviewed and stable  Post vital signs: Reviewed and stable  Complications: No apparent anesthesia complications

## 2012-02-23 LAB — CBC
HCT: 29.3 % — ABNORMAL LOW (ref 36.0–46.0)
Hemoglobin: 9.5 g/dL — ABNORMAL LOW (ref 12.0–15.0)
MCH: 28.2 pg (ref 26.0–34.0)
MCHC: 32.4 g/dL (ref 30.0–36.0)
MCV: 86.9 fL (ref 78.0–100.0)
Platelets: 189 10*3/uL (ref 150–400)
RBC: 3.37 MIL/uL — ABNORMAL LOW (ref 3.87–5.11)
RDW: 13.7 % (ref 11.5–15.5)
WBC: 9.8 10*3/uL (ref 4.0–10.5)

## 2012-02-23 MED ORDER — IBUPROFEN 800 MG PO TABS: 800.0000 mg | ORAL_TABLET | Freq: Three times a day (TID) | ORAL | Status: DC | PRN

## 2012-02-23 MED ORDER — OXYCODONE-ACETAMINOPHEN 5-325 MG PO TABS: 1.0000 | ORAL_TABLET | ORAL | Status: DC | PRN

## 2012-02-23 NOTE — Discharge Summary (Signed)
Physician Discharge Summary  Patient ID: Jaime Rhodes MRN: [id number] DOB/AGE: [birth year] 36 y.o.  Admit date: 02/22/2012 Discharge date: 02/23/2012  Admission Diagnoses:  Discharge Diagnoses:  Active Problems:  * No active hospital problems. *    Discharged Condition: good  Hospital Course: LAVH, D/C on POD # 1  Consults: none  Significant Diagnostic Studies: labs:  CBC    Component Value Date/Time   WBC 9.8 02/23/2012 0535   RBC 3.37* 02/23/2012 0535   HGB 9.5* 02/23/2012 0535   HCT 29.3* 02/23/2012 0535   PLT 189 02/23/2012 0535   MCV 86.9 02/23/2012 0535   MCH 28.2 02/23/2012 0535   MCHC 32.4 02/23/2012 0535   RDW 13.7 02/23/2012 0535   LYMPHSABS 1.9 11/19/2011 0821   MONOABS 0.4 11/19/2011 0821   EOSABS 0.1 11/19/2011 0821   BASOSABS 0.0 11/19/2011 0821      Treatments: surgery: LAVH  Discharge Exam: Blood pressure 115/76, pulse 85, temperature 98.7 F (37.1 C), temperature source Oral, resp. rate 16, height 5\' 8"  (1.727 m), weight 212 lb (96.163 kg), SpO2 100.00%. abd soft +BS, Incs C/D  Disposition: 01-Home or Self Care     Medication List     As of 02/23/2012  8:32 AM    STOP taking these medications         naproxen sodium 220 MG tablet   Commonly known as: ANAPROX      TAKE these medications         esomeprazole 40 MG capsule   Commonly known as: NEXIUM   Take 40 mg by mouth daily as needed. For reflux      ibuprofen 800 MG tablet   Commonly known as: ADVIL,MOTRIN   Take 1 tablet (800 mg total) by mouth every 8 (eight) hours as needed (mild pain).      oxyCODONE-acetaminophen 5-325 MG per tablet   Commonly known as: PERCOCET/ROXICET   Take 1-2 tablets by mouth every 4 (four) hours as needed (moderate to severe pain (when tolerating fluids)).      valACYclovir 500 MG tablet   Commonly known as: VALTREX   Take 500 mg by mouth daily as needed. For fever blister      valsartan-hydrochlorothiazide 320-25 MG per tablet   Commonly known  as: DIOVAN-HCT   Take 1 tablet by mouth daily.           Follow-up Information    Follow up with Meriel Pica, MD. Schedule an appointment as soon as possible for a visit in 7 days.   Contact information:   [address] [place] [address] [phone]          Signed: Meriel Pica 02/23/2012, 8:32 AM

## 2012-02-23 NOTE — Progress Notes (Signed)
1 Day Post-Op Procedure(s) (LRB): LAPAROSCOPIC ASSISTED VAGINAL HYSTERECTOMY (N/A)  Subjective: Patient reports tolerating PO.    Objective: I have reviewed patient's vital signs and labs. BP 115/76  Pulse 85  Temp 98.7 F (37.1 C) (Oral)  Resp 16  Ht 5\' 8"  (1.727 Rhodes)  Wt 212 lb (96.163 kg)  BMI 32.23 kg/m2  SpO2 100% CBC    Component Value Date/Time   WBC 9.8 02/23/2012 0535   RBC 3.37* 02/23/2012 0535   HGB 9.5* 02/23/2012 0535   HCT 29.3* 02/23/2012 0535   PLT 189 02/23/2012 0535   MCV 86.9 02/23/2012 0535   MCH 28.2 02/23/2012 0535   MCHC 32.4 02/23/2012 0535   RDW 13.7 02/23/2012 0535   LYMPHSABS 1.9 11/19/2011 0821   MONOABS 0.4 11/19/2011 0821   EOSABS 0.1 11/19/2011 0821   BASOSABS 0.0 11/19/2011 0821     abd + BS, soft, incs c/d  Assessment: s/p Procedure(s) (LRB) with comments: LAPAROSCOPIC ASSISTED VAGINAL HYSTERECTOMY (N/A): stable  Plan: Discharge home  LOS: 1 day    Jaime Rhodes 02/23/2012, 8:29 AM

## 2012-02-25 ENCOUNTER — Encounter (HOSPITAL_COMMUNITY): Payer: Self-pay | Admitting: Obstetrics and Gynecology

## 2012-05-24 ENCOUNTER — Other Ambulatory Visit: Payer: Self-pay | Admitting: Obstetrics and Gynecology

## 2012-05-24 DIAGNOSIS — R928 Other abnormal and inconclusive findings on diagnostic imaging of breast: Secondary | ICD-10-CM

## 2012-05-30 ENCOUNTER — Ambulatory Visit
Admission: RE | Admit: 2012-05-30 | Discharge: 2012-05-30 | Disposition: A | Discharge status: Home / Self Care | Payer: 59 | Source: Ambulatory Visit | Attending: Obstetrics and Gynecology | Admitting: Obstetrics and Gynecology

## 2012-05-30 DIAGNOSIS — R928 Other abnormal and inconclusive findings on diagnostic imaging of breast: Secondary | ICD-10-CM

## 2012-11-30 ENCOUNTER — Encounter (HOSPITAL_COMMUNITY): Payer: Self-pay | Admitting: Emergency Medicine

## 2012-11-30 ENCOUNTER — Emergency Department (HOSPITAL_COMMUNITY)
Admission: EM | Admit: 2012-11-30 | Discharge: 2012-11-30 | Disposition: A | Discharge status: Home / Self Care | Payer: 59 | Source: Home / Self Care | Attending: Family Medicine | Admitting: Family Medicine

## 2012-11-30 DIAGNOSIS — L739 Follicular disorder, unspecified: Secondary | ICD-10-CM

## 2012-11-30 DIAGNOSIS — L738 Other specified follicular disorders: Secondary | ICD-10-CM

## 2012-11-30 DIAGNOSIS — L678 Other hair color and hair shaft abnormalities: Secondary | ICD-10-CM

## 2012-11-30 MED ORDER — DOXYCYCLINE HYCLATE 100 MG PO CAPS: 100.0000 mg | ORAL_CAPSULE | Freq: Two times a day (BID) | ORAL | Status: DC

## 2012-11-30 NOTE — ED Provider Notes (Signed)
Jaime Rhodes is a 37 y.o. female who presents to Urgent Care today for painful papule left inner thigh.  Patient noted a papule on her left inner thigh yesterday. This worsened and developed surrounding erythema induration. It is tender. She's tried some rubbing alcohol which has not helped. She denies any radiating pain weakness numbness fevers or chills. She feels well otherwise. No nausea vomiting or diarrhea. No actual bug bite that she can recall.   Past Medical History  Diagnosis Date  . Hypertension   . GERD (gastroesophageal reflux disease)     treated prn with nexium-will take dos   History  Substance Use Topics  . Smoking status: Never Smoker   . Smokeless tobacco: Not on file  . Alcohol Use: No   ROS as above Medications reviewed. No current facility-administered medications for this encounter.   Current Outpatient Prescriptions  Medication Sig Dispense Refill  . esomeprazole (NEXIUM) 40 MG capsule Take 40 mg by mouth daily as needed. For reflux      . ibuprofen (ADVIL,MOTRIN) 800 MG tablet Take 1 tablet (800 mg total) by mouth every 8 (eight) hours as needed (mild pain).  30 tablet  2  . valACYclovir (VALTREX) 500 MG tablet Take 500 mg by mouth daily as needed. For fever blister      . valsartan-hydrochlorothiazide (DIOVAN-HCT) 320-25 MG per tablet Take 1 tablet by mouth daily.      Marland Kitchen doxycycline (VIBRAMYCIN) 100 MG capsule Take 1 capsule (100 mg total) by mouth 2 (two) times daily.  20 capsule  0    Exam:  BP 127/87  Pulse 92  Temp(Src) 98.3 F (36.8 C) (Oral)  Resp 16  SpO2 98%  LMP 01/23/2012 Gen: Well NAD  skin : Small erythematous papule left inner thigh with 2 cm of surrounding erythema. Some induration present no fluctuance present. Tender to touch   No results found for this or any previous visit (from the past 24 hour(s)). No results found.  Assessment and Plan: 37 y.o. female with  folliculitis of the inner thigh. No evidence of abscess yet. Plan to  treat empirically with doxycycline for 7-10 days. Use over-the-counter pain medications as needed  Discussed warning signs or symptoms. Please see discharge instructions. Patient expresses understanding.      Rodolph Bong, MD 11/30/12 [phone]

## 2012-11-30 NOTE — ED Notes (Signed)
C/o infection/bite on inner left thigh.  Patient says she noticed it yesterday which was red and small looking like a pimple.  Patient says now it is swelling and uncomfortable.  Alcohol was used as treatment.

## 2014-08-20 ENCOUNTER — Other Ambulatory Visit: Payer: Self-pay | Admitting: Obstetrics and Gynecology

## 2014-08-21 LAB — CYTOLOGY - PAP

## 2015-02-25 ENCOUNTER — Telehealth: Payer: 59 | Admitting: Family

## 2015-02-25 DIAGNOSIS — J329 Chronic sinusitis, unspecified: Secondary | ICD-10-CM

## 2015-02-25 DIAGNOSIS — A499 Bacterial infection, unspecified: Secondary | ICD-10-CM

## 2015-02-25 DIAGNOSIS — B9689 Other specified bacterial agents as the cause of diseases classified elsewhere: Secondary | ICD-10-CM

## 2015-02-25 MED ORDER — LEVOFLOXACIN 500 MG PO TABS: 500.0000 mg | ORAL_TABLET | Freq: Every day | ORAL | Status: DC

## 2015-02-25 MED ORDER — BENZONATATE 100 MG PO CAPS: 100.0000 mg | ORAL_CAPSULE | Freq: Three times a day (TID) | ORAL | Status: DC | PRN

## 2015-02-25 NOTE — Progress Notes (Signed)
We are sorry that you are not feeling well.  Here is how we plan to help!  Based on what you have shared with me it looks like you have sinusitis.  Sinusitis is inflammation and infection in the sinus cavities of the head.  Based on your presentation I believe you most likely have Acute Bacterial Sinusitis.  This is an infection caused by bacteria and is treated with antibiotics. Due to your penicillin allergy, I have prescribed Levaquin 500mg , 1 tab daily x 7 days. You may use an oral decongestant such as Mucinex D or if you have glaucoma or high blood pressure use plain Mucinex. Saline nasal spray help and can safely be used as often as needed for congestion.  If you develop worsening sinus pain, fever or notice severe headache and vision changes, or if symptoms are not better after completion of antibiotic, please schedule an appointment with a health care provider.    I have also prescribed Tessalon Perles 100-200mg  (1-2 caps) every 8 hours as needed for your cough so that you can sleep better.   Sinus infections are not as easily transmitted as other respiratory infection, however we still recommend that you avoid close contact with loved ones, especially the very young and elderly.  Remember to wash your hands thoroughly throughout the day as this is the number one way to prevent the spread of infection!  Home Care:  Only take medications as instructed by your medical team.  Complete the entire course of an antibiotic.  Do not take these medications with alcohol.  A steam or ultrasonic humidifier can help congestion.  You can place a towel over your head and breathe in the steam from hot water coming from a faucet.  Avoid close contacts especially the very young and the elderly.  Cover your mouth when you cough or sneeze.  Always remember to wash your hands.  Get Help Right Away If:  You develop worsening fever or sinus pain.  You develop a severe head ache or visual  changes.  Your symptoms persist after you have completed your treatment plan.  Make sure you  Understand these instructions.  Will watch your condition.  Will get help right away if you are not doing well or get worse.  Your e-visit answers were reviewed by a board certified advanced clinical practitioner to complete your personal care plan.  Depending on the condition, your plan could have included both over the counter or prescription medications.  If there is a problem please reply  once you have received a response from your provider.  Your safety is important to [place].  If you have drug allergies check your prescription carefully.    You can use MyChart to ask questions about today's visit, request a non-urgent call back, or ask for a work or school excuse for 24 hours related to this e-Visit. If it has been greater than 24 hours you will need to follow up with your provider, or enter a new e-Visit to address those concerns.  You will get an e-mail in the next two days asking about your experience.  I hope that your e-visit has been valuable and will speed your recovery. Thank you for using e-visits.

## 2015-03-20 ENCOUNTER — Telehealth: Payer: 59 | Admitting: Physician Assistant

## 2015-03-20 DIAGNOSIS — J019 Acute sinusitis, unspecified: Secondary | ICD-10-CM

## 2015-03-20 DIAGNOSIS — B9689 Other specified bacterial agents as the cause of diseases classified elsewhere: Secondary | ICD-10-CM

## 2015-03-20 MED ORDER — DOXYCYCLINE HYCLATE 100 MG PO CAPS: 100.0000 mg | ORAL_CAPSULE | Freq: Two times a day (BID) | ORAL | Status: DC

## 2015-03-20 MED FILL — DOXYCYCLINE HYCLATE 100 MG: 100 | 10 days supply | Qty: 20 | Fill #0

## 2015-03-20 NOTE — Progress Notes (Signed)

## 2015-04-30 DIAGNOSIS — I10 Essential (primary) hypertension: Secondary | ICD-10-CM | POA: Diagnosis not present

## 2015-04-30 DIAGNOSIS — E785 Hyperlipidemia, unspecified: Secondary | ICD-10-CM | POA: Diagnosis not present

## 2015-04-30 DIAGNOSIS — Z6835 Body mass index (BMI) 35.0-35.9, adult: Secondary | ICD-10-CM | POA: Diagnosis not present

## 2015-04-30 DIAGNOSIS — K219 Gastro-esophageal reflux disease without esophagitis: Secondary | ICD-10-CM | POA: Diagnosis not present

## 2015-04-30 MED FILL — VALSARTAN-HCTZ 320-25 MG TA: 320-25 | 90 days supply | Qty: 90 | Fill #0

## 2015-09-18 DIAGNOSIS — Z01419 Encounter for gynecological examination (general) (routine) without abnormal findings: Secondary | ICD-10-CM | POA: Diagnosis not present

## 2015-09-18 DIAGNOSIS — Z6836 Body mass index (BMI) 36.0-36.9, adult: Secondary | ICD-10-CM | POA: Diagnosis not present

## 2015-09-29 MED FILL — VALSARTAN-HCTZ 320-25 MG TA: 320-25 | 90 days supply | Qty: 90 | Fill #1

## 2015-10-02 DIAGNOSIS — Z1231 Encounter for screening mammogram for malignant neoplasm of breast: Secondary | ICD-10-CM | POA: Diagnosis not present

## 2015-11-06 DIAGNOSIS — I10 Essential (primary) hypertension: Secondary | ICD-10-CM | POA: Diagnosis not present

## 2015-11-06 DIAGNOSIS — G8929 Other chronic pain: Secondary | ICD-10-CM | POA: Diagnosis not present

## 2015-11-06 DIAGNOSIS — M25562 Pain in left knee: Secondary | ICD-10-CM | POA: Diagnosis not present

## 2015-11-06 DIAGNOSIS — E785 Hyperlipidemia, unspecified: Secondary | ICD-10-CM | POA: Diagnosis not present

## 2015-11-19 DIAGNOSIS — S86912A Strain of unspecified muscle(s) and tendon(s) at lower leg level, left leg, initial encounter: Secondary | ICD-10-CM | POA: Diagnosis not present

## 2015-11-19 MED FILL — MELOXICAM 15 MG TABLET: 15 | 30 days supply | Qty: 30 | Fill #0

## 2015-12-16 DIAGNOSIS — I10 Essential (primary) hypertension: Secondary | ICD-10-CM | POA: Diagnosis not present

## 2015-12-17 DIAGNOSIS — S86912D Strain of unspecified muscle(s) and tendon(s) at lower leg level, left leg, subsequent encounter: Secondary | ICD-10-CM | POA: Diagnosis not present

## 2015-12-18 DIAGNOSIS — M25562 Pain in left knee: Secondary | ICD-10-CM | POA: Diagnosis not present

## 2016-01-27 MED FILL — VALSARTAN-HCTZ 320-25 MG TA: 320-25 | 90 days supply | Qty: 90 | Fill #2

## 2016-03-25 ENCOUNTER — Telehealth: Payer: 59 | Admitting: Family

## 2016-03-25 DIAGNOSIS — J019 Acute sinusitis, unspecified: Secondary | ICD-10-CM

## 2016-03-25 MED ORDER — FLUTICASONE PROPIONATE 50 MCG/ACT NA SUSP: 2.0000 | Freq: Every day | NASAL | 6 refills | Status: DC

## 2016-03-25 MED ORDER — AZELASTINE HCL 0.1 % NA SOLN: 2.0000 | Freq: Two times a day (BID) | NASAL | 0 refills | Status: DC

## 2016-03-25 NOTE — Addendum Note (Signed)
Addended by: Raiford Noble on: 03/25/2016 08:17 PM   Modules accepted: Orders

## 2016-03-25 NOTE — Progress Notes (Signed)

## 2016-05-10 DIAGNOSIS — M7542 Impingement syndrome of left shoulder: Secondary | ICD-10-CM | POA: Diagnosis not present

## 2016-05-10 MED FILL — MELOXICAM 15 MG TABLET: 15 | 30 days supply | Qty: 30 | Fill #0

## 2016-06-09 DIAGNOSIS — M7542 Impingement syndrome of left shoulder: Secondary | ICD-10-CM | POA: Diagnosis not present

## 2016-06-09 MED FILL — MELOXICAM 15 MG TABLET: 15 | 30 days supply | Qty: 30 | Fill #0

## 2016-06-10 DIAGNOSIS — E785 Hyperlipidemia, unspecified: Secondary | ICD-10-CM | POA: Diagnosis not present

## 2016-06-10 DIAGNOSIS — J0100 Acute maxillary sinusitis, unspecified: Secondary | ICD-10-CM | POA: Diagnosis not present

## 2016-06-10 DIAGNOSIS — K219 Gastro-esophageal reflux disease without esophagitis: Secondary | ICD-10-CM | POA: Diagnosis not present

## 2016-06-10 DIAGNOSIS — I10 Essential (primary) hypertension: Secondary | ICD-10-CM | POA: Diagnosis not present

## 2016-06-10 MED FILL — AZITHROMYCIN 250 MG TABLET: 250 | 5 days supply | Qty: 6 | Fill #0

## 2016-06-10 MED FILL — VALACYCLOVIR HCL 500 MG TAB: 500 | 10 days supply | Qty: 20 | Fill #0

## 2016-06-10 MED FILL — VALSARTAN-HCTZ 320-25 MG TA: 320-25 | 90 days supply | Qty: 90 | Fill #0

## 2016-07-22 DIAGNOSIS — M25512 Pain in left shoulder: Secondary | ICD-10-CM | POA: Diagnosis not present

## 2016-07-28 DIAGNOSIS — M7502 Adhesive capsulitis of left shoulder: Secondary | ICD-10-CM | POA: Diagnosis not present

## 2016-09-16 MED FILL — MELOXICAM 15 MG TABLET: 15 | 30 days supply | Qty: 30 | Fill #1

## 2016-09-21 MED FILL — PANTOPRAZOLE SOD DR 40 MG T: 40 | 90 days supply | Qty: 90 | Fill #0

## 2016-11-17 MED FILL — MELOXICAM 15 MG TABLET: 15 | 30 days supply | Qty: 30 | Fill #1

## 2016-11-29 MED FILL — VALSARTAN-HCTZ 320-25 MG TA: 320-25 | 90 days supply | Qty: 90 | Fill #1

## 2016-12-06 DIAGNOSIS — M25512 Pain in left shoulder: Secondary | ICD-10-CM | POA: Diagnosis not present

## 2016-12-06 DIAGNOSIS — M542 Cervicalgia: Secondary | ICD-10-CM | POA: Diagnosis not present

## 2016-12-06 MED FILL — METHYLPREDNISOLONE 4 MG TAB: 4 | 6 days supply | Qty: 21 | Fill #0

## 2016-12-23 DIAGNOSIS — Z130 Encounter for screening for diseases of the blood and blood-forming organs and certain disorders involving the immune mechanism: Secondary | ICD-10-CM | POA: Diagnosis not present

## 2016-12-23 DIAGNOSIS — R739 Hyperglycemia, unspecified: Secondary | ICD-10-CM | POA: Diagnosis not present

## 2016-12-23 DIAGNOSIS — E785 Hyperlipidemia, unspecified: Secondary | ICD-10-CM | POA: Diagnosis not present

## 2016-12-23 DIAGNOSIS — J0100 Acute maxillary sinusitis, unspecified: Secondary | ICD-10-CM | POA: Diagnosis not present

## 2016-12-23 DIAGNOSIS — Z0000 Encounter for general adult medical examination without abnormal findings: Secondary | ICD-10-CM | POA: Diagnosis not present

## 2016-12-23 DIAGNOSIS — I10 Essential (primary) hypertension: Secondary | ICD-10-CM | POA: Diagnosis not present

## 2016-12-23 MED FILL — AZITHROMYCIN 250 MG TABLET: 250 | 5 days supply | Qty: 6 | Fill #0

## 2017-01-12 DIAGNOSIS — Z8042 Family history of malignant neoplasm of prostate: Secondary | ICD-10-CM | POA: Diagnosis not present

## 2017-01-12 DIAGNOSIS — Z809 Family history of malignant neoplasm, unspecified: Secondary | ICD-10-CM | POA: Diagnosis not present

## 2017-01-12 DIAGNOSIS — Z01419 Encounter for gynecological examination (general) (routine) without abnormal findings: Secondary | ICD-10-CM | POA: Diagnosis not present

## 2017-01-12 DIAGNOSIS — Z6835 Body mass index (BMI) 35.0-35.9, adult: Secondary | ICD-10-CM | POA: Diagnosis not present

## 2017-01-12 DIAGNOSIS — Z1231 Encounter for screening mammogram for malignant neoplasm of breast: Secondary | ICD-10-CM | POA: Diagnosis not present

## 2017-01-12 DIAGNOSIS — Z800 Family history of malignant neoplasm of digestive organs: Secondary | ICD-10-CM | POA: Diagnosis not present

## 2017-01-14 ENCOUNTER — Other Ambulatory Visit: Payer: Self-pay | Admitting: Obstetrics and Gynecology

## 2017-01-14 DIAGNOSIS — R928 Other abnormal and inconclusive findings on diagnostic imaging of breast: Secondary | ICD-10-CM

## 2017-01-20 ENCOUNTER — Ambulatory Visit
Admission: RE | Admit: 2017-01-20 | Discharge: 2017-01-20 | Disposition: A | Discharge status: Home / Self Care | Payer: 59 | Source: Ambulatory Visit | Attending: Obstetrics and Gynecology | Admitting: Obstetrics and Gynecology

## 2017-01-20 DIAGNOSIS — N6311 Unspecified lump in the right breast, upper outer quadrant: Secondary | ICD-10-CM | POA: Diagnosis not present

## 2017-01-20 DIAGNOSIS — R928 Other abnormal and inconclusive findings on diagnostic imaging of breast: Secondary | ICD-10-CM | POA: Diagnosis not present

## 2017-01-20 DIAGNOSIS — N6312 Unspecified lump in the right breast, upper inner quadrant: Secondary | ICD-10-CM | POA: Diagnosis not present

## 2017-04-07 MED FILL — VALSARTAN-HCTZ 320-25 MG TA: 320-25 | 90 days supply | Qty: 90 | Fill #2

## 2017-06-01 ENCOUNTER — Ambulatory Visit (INDEPENDENT_AMBULATORY_CARE_PROVIDER_SITE_OTHER): Payer: 59 | Admitting: Allergy and Immunology

## 2017-06-01 ENCOUNTER — Encounter: Payer: Self-pay | Admitting: Allergy and Immunology

## 2017-06-01 ENCOUNTER — Telehealth: Payer: Self-pay

## 2017-06-01 VITALS — BP 124/70 | HR 107 | Temp 98.3°F | Resp 19 | Ht 66.5 in | Wt 214.4 lb

## 2017-06-01 DIAGNOSIS — L989 Disorder of the skin and subcutaneous tissue, unspecified: Secondary | ICD-10-CM

## 2017-06-01 DIAGNOSIS — L308 Other specified dermatitis: Secondary | ICD-10-CM | POA: Diagnosis not present

## 2017-06-01 MED ORDER — TACROLIMUS 0.1 % EX OINT: TOPICAL_OINTMENT | CUTANEOUS | 5 refills | Status: DC

## 2017-06-01 MED ORDER — CETIRIZINE HCL 10 MG PO TABS: ORAL_TABLET | ORAL | 5 refills | Status: DC

## 2017-06-01 MED ORDER — MOMETASONE FUROATE 0.1 % EX CREA: TOPICAL_CREAM | CUTANEOUS | 5 refills | Status: DC

## 2017-06-01 NOTE — Progress Notes (Signed)
Dear Dr. Maceo Pro,  Thank you for referring Jaime Rhodes to the [facility] on 06/01/2017.   Below is a summation of this patient's evaluation and recommendations.  Thank you for your referral. I will keep you informed about this patient's response to treatment.   If you have any questions please do not hesitate to contact me.   Sincerely,  Jiles Prows, MD Allergy / Immunology [facility]   ______________________________________________________________________    NEW PATIENT NOTE  Referring Provider: Briscoe Deutscher, MD Primary Provider: Briscoe Deutscher, MD Date of office visit: 06/01/2017    Subjective:   Chief Complaint:  Jaime Rhodes (DOB: 06-07-1975) is a 42 y.o. female who presents to the clinic on 06/01/2017 with a chief complaint of Rash .     HPI: Jaime Rhodes presents to this clinic in evaluation of dermatitis.  Around the first week of February 2019 she developed a sensation of a itch or sting on her left arm.  She subsequently noticed two erythematous dots which appeared to progressed to develop a circular lesion approximately 2 cm in diameter which then changed to develop these raised thickened extremely red areas within that circle that might of had small white tips.  This lesion is intensely itchy and sometimes does wake her up scratching.  She has tried her husband's triamcinolone topical steroid which may help the redness of this area but certainly does not result in resolution of this area.  She is also tried Neosporin which has not helped.  She has no associated systemic or constitutional symptoms.  She does not really have an atopic history.  She has not had a significant environmental change or new medication administered during 2019.  Past Medical History:  Diagnosis Date  . GERD (gastroesophageal reflux disease)    treated prn with nexium-will take dos  .  Hypertension     Past Surgical History:  Procedure Laterality Date  . LAPAROSCOPIC ASSISTED VAGINAL HYSTERECTOMY  02/22/2012   Procedure: LAPAROSCOPIC ASSISTED VAGINAL HYSTERECTOMY;  Surgeon: Margarette Asal, MD;  Location: [facility];  Service: Gynecology;  Laterality: N/A;  . WISDOM TOOTH EXTRACTION      Allergies as of 06/01/2017      Reactions   Penicillins Rash      Medication List      ibuprofen 800 MG tablet Commonly known as:  ADVIL,MOTRIN Take 1 tablet (800 mg total) by mouth every 8 (eight) hours as needed (mild pain).   valACYclovir 500 MG tablet Commonly known as:  VALTREX Take 500 mg by mouth daily as needed. For fever blister   valsartan-hydrochlorothiazide 320-25 MG tablet Commonly known as:  DIOVAN-HCT Take 1 tablet by mouth daily.       Review of systems negative except as noted in HPI / PMHx or noted below:  Review of Systems  Constitutional: Negative.   HENT: Negative.   Eyes: Negative.   Respiratory: Negative.   Cardiovascular: Negative.   Gastrointestinal: Negative.   Genitourinary: Negative.   Musculoskeletal: Negative.   Skin: Negative.   Neurological: Negative.   Endo/Heme/Allergies: Negative.   Psychiatric/Behavioral: Negative.     History reviewed. No pertinent family history.  Social History   Socioeconomic History  . Marital status: Married    Spouse name: Not on file  . Number of children: Not on file  . Years of education: Not on file  . Highest education level: Not on file  Occupational History  . Not on file  Social Needs  . Financial resource strain: Not on file  . Food insecurity:    Worry: Not on file    Inability: Not on file  . Transportation needs:    Medical: Not on file    Non-medical: Not on file  Tobacco Use  . Smoking status: Never Smoker  . Smokeless tobacco: Never Used  Substance and Sexual Activity  . Alcohol use: No  . Drug use: No  . Sexual activity: Yes  Lifestyle  . Physical activity:    Days  per week: Not on file    Minutes per session: Not on file  . Stress: Not on file  Relationships  . Social connections:    Talks on phone: Not on file    Gets together: Not on file    Attends religious service: Not on file    Active member of club or organization: Not on file    Attends meetings of clubs or organizations: Not on file    Relationship status: Not on file  . Intimate partner violence:    Fear of current or ex partner: Not on file    Emotionally abused: Not on file    Physically abused: Not on file    Forced sexual activity: Not on file  Other Topics Concern  . Not on file  Social History Narrative  . Not on file    Environmental and Social history  Lives in a house with a dry environment, dogs and cats located inside the household, carpet in the bedroom, plastic on the bed, no plastic on the pillow, and no smokers located inside the household.  She works in TEFL teacher as a Teacher, adult education.  Objective:   Vitals:   06/01/17 0925  BP: 124/70  Pulse: (!) 107  Resp: 19  Temp: 98.3 F (36.8 C)  SpO2: 95%   Height: 5' 6.5" (168.9 cm) Weight: 214 lb 6.4 oz (97.3 kg)  Physical Exam  Constitutional: She is well-developed, well-nourished, and in no distress.  HENT:  Head: Normocephalic.  Right Ear: Tympanic membrane, external ear and ear canal normal.  Left Ear: Tympanic membrane, external ear and ear canal normal.  Nose: Nose normal. No mucosal edema or rhinorrhea.  Mouth/Throat: Uvula is midline, oropharynx is clear and moist and mucous membranes are normal. No oropharyngeal exudate.  Eyes: Conjunctivae are normal.  Neck: Trachea normal. No tracheal tenderness present. No tracheal deviation present. No thyromegaly present.  Cardiovascular: Normal rate, regular rhythm, S1 normal, S2 normal and normal heart sounds.  No murmur heard. Pulmonary/Chest: Breath sounds normal. No stridor. No respiratory distress. She has no wheezes. She has no rales.    Musculoskeletal: She exhibits no edema.  Lymphadenopathy:       Head (right side): No tonsillar adenopathy present.       Head (left side): No tonsillar adenopathy present.    She has no cervical adenopathy.  Neurological: She is alert. Gait normal.  Skin: Rash (Left forearm 2 cm diameter erythematous area with approximately 3 slightly indurated papules without any vesicles or discharge) noted. She is not diaphoretic. No erythema. Nails show no clubbing.  Psychiatric: Mood and affect normal.    Diagnostics: Allergy skin tests were not performed.   Assessment and Plan:    1. Inflammatory dermatosis     1.  Use a combination of mometasone 0.1% cream followed by Protopic 0.1% ointment twice a day to skin lesion until resolved  2.  If no resolution recommend biopsy with dermatologist. Fungal, bacterial, autoimmune?  3.  Can use Zyrtec 10 mg 1-2 tablets daily to raise itch threshold  4. Contact clinic with update in 2 weeks  Given the pruritic nature of Jaime Rhodes dermatosis I will assume that we can get this under good control with aggressive anti-inflammatory therapy administered topically as noted above.  If this treatment does not result in resolution of this issue then we need to consider the possibility that she has sporotrichosis or other fungal infection or bacterial infection and it would probably be best to get a biopsy of this lesion with a dermatologist.  She will contact me in 2 weeks with an update concerning her response.  Jiles Prows, MD Allergy / Immunology [facility]

## 2017-06-01 NOTE — Telephone Encounter (Signed)
Needs either elidl or protopic. Look at [facility]. May need to go to another pharmacy.

## 2017-06-01 NOTE — Telephone Encounter (Signed)
Spoke to [facility] outpatient pharmacy and states that Elidil is not covered as well. The system does not tell him what is. Dr Neldon Mc please advise.

## 2017-06-01 NOTE — Patient Instructions (Addendum)
  1.  Use a combination of mometasone 0.1% cream followed by Protopic 0.1% ointment twice a day to skin lesion until resolved  2.  If no resolution recommend biopsy with dermatologist. Fungal, bacterial, autoimmune?  3.  Can use Zyrtec 10 mg 1-2 tablets daily to raise itch threshold  4. Contact clinic with update in 2 weeks

## 2017-06-01 NOTE — Telephone Encounter (Signed)
Spoke to [place] at pharmacy she ran [facility] and it will cost $241.70 due to patient having a high deductible plan that needs to be meet first. Left message for patient advising of this.

## 2017-06-01 NOTE — Telephone Encounter (Signed)
She can use Elidil followed by mometasone 0.1% OINTMENT. Would that be cheaper?

## 2017-06-01 NOTE — Telephone Encounter (Signed)
Patient was seen this morning and the Protopic will cost her $500.00 is there another prescription that will be cheaper.  Please Advise

## 2017-06-02 NOTE — Telephone Encounter (Signed)
Spoke to patient advised we switched medications and to shop around per Dr Neldon Mc. Patient verbalized understanding.

## 2017-06-02 NOTE — Telephone Encounter (Signed)
She may need to shop around for either elidil or protopic. Please inform her to shop around at different pharmacies.

## 2017-06-06 ENCOUNTER — Encounter: Payer: Self-pay | Admitting: Allergy and Immunology

## 2017-06-23 DIAGNOSIS — Z6835 Body mass index (BMI) 35.0-35.9, adult: Secondary | ICD-10-CM | POA: Diagnosis not present

## 2017-06-23 DIAGNOSIS — J0100 Acute maxillary sinusitis, unspecified: Secondary | ICD-10-CM | POA: Diagnosis not present

## 2017-06-23 DIAGNOSIS — R739 Hyperglycemia, unspecified: Secondary | ICD-10-CM | POA: Diagnosis not present

## 2017-06-23 DIAGNOSIS — I10 Essential (primary) hypertension: Secondary | ICD-10-CM | POA: Diagnosis not present

## 2017-06-23 DIAGNOSIS — E785 Hyperlipidemia, unspecified: Secondary | ICD-10-CM | POA: Diagnosis not present

## 2017-08-04 MED FILL — VALSARTAN-HCTZ 320-25 MG TA: 320-25 | 90 days supply | Qty: 90 | Fill #0

## 2017-11-04 MED FILL — MELOXICAM 15 MG TABLET: 15 | 30 days supply | Qty: 30 | Fill #0

## 2017-12-02 DIAGNOSIS — B079 Viral wart, unspecified: Secondary | ICD-10-CM | POA: Diagnosis not present

## 2018-02-02 MED FILL — VALSARTAN-HCTZ 320-25 MG TA: 320-25 | 90 days supply | Qty: 90 | Fill #0

## 2018-03-09 DIAGNOSIS — Z1231 Encounter for screening mammogram for malignant neoplasm of breast: Secondary | ICD-10-CM | POA: Diagnosis not present

## 2018-03-09 DIAGNOSIS — Z6836 Body mass index (BMI) 36.0-36.9, adult: Secondary | ICD-10-CM | POA: Diagnosis not present

## 2018-03-09 DIAGNOSIS — Z01419 Encounter for gynecological examination (general) (routine) without abnormal findings: Secondary | ICD-10-CM | POA: Diagnosis not present

## 2018-03-17 ENCOUNTER — Telehealth: Payer: 59 | Admitting: Family

## 2018-03-17 DIAGNOSIS — J208 Acute bronchitis due to other specified organisms: Secondary | ICD-10-CM | POA: Diagnosis not present

## 2018-03-17 DIAGNOSIS — B9689 Other specified bacterial agents as the cause of diseases classified elsewhere: Secondary | ICD-10-CM | POA: Diagnosis not present

## 2018-03-17 MED ORDER — AZITHROMYCIN 250 MG PO TABS: ORAL_TABLET | ORAL | 0 refills | Status: DC

## 2018-03-17 MED ORDER — BENZONATATE 100 MG PO CAPS: 100.0000 mg | ORAL_CAPSULE | Freq: Three times a day (TID) | ORAL | 0 refills | Status: DC | PRN

## 2018-03-17 MED ORDER — ALBUTEROL SULFATE 108 (90 BASE) MCG/ACT IN AEPB: 2.0000 | INHALATION_SPRAY | Freq: Four times a day (QID) | RESPIRATORY_TRACT | 2 refills | Status: DC | PRN

## 2018-03-17 MED ORDER — PREDNISONE 5 MG PO TABS
5.0000 mg | ORAL_TABLET | ORAL | 0 refills | Status: DC
Start: 2018-03-17 — End: 2020-08-14

## 2018-03-17 MED FILL — AZITHROMYCIN 250 MG TABLET: 250 | 5 days supply | Qty: 6 | Fill #0

## 2018-03-17 MED FILL — BENZONATATE 100 MG CAPS: 100 | 5 days supply | Qty: 30 | Fill #0

## 2018-03-17 MED FILL — predniSONE 5 MG TABS: 5 | 6 days supply | Qty: 21 | Fill #0

## 2018-03-17 MED FILL — PROAIR RESPICLICK INHAL PWD: 108 (90 BAS | 25 days supply | Qty: 1 | Fill #0

## 2018-03-17 NOTE — Progress Notes (Signed)
Thank you for the details you included in the comment boxes. Those details are very helpful in determining the best course of treatment for you and help [place] to provide the best care. See below. I have also added an Albuterol inhaler, take 2 puffs every 6 hours as needed for shortness of breath.   We are sorry that you are not feeling well.  Here is how we plan to help!  Based on your presentation I believe you most likely have A cough due to bacteria.  When patients have a fever and a productive cough with a change in color or increased sputum production, we are concerned about bacterial bronchitis.  If left untreated it can progress to pneumonia.  If your symptoms do not improve with your treatment plan it is important that you contact your provider.   I have prescribed Azithromyin 250 mg: two tablets now and then one tablet daily for 4 additonal days    In addition you may use A non-prescription cough medication called Mucinex DM: take 2 tablets every 12 hours. and A prescription cough medication called Tessalon Perles 100mg . You may take 1-2 capsules every 8 hours as needed for your cough.  Prednisone 5 mg daily for 6 days (see taper instructions below)  Directions for 6 day taper: Day 1: 2 tablets before breakfast, 1 after both lunch & dinner and 2 at bedtime Day 2: 1 tab before breakfast, 1 after both lunch & dinner and 2 at bedtime Day 3: 1 tab at each meal & 1 at bedtime Day 4: 1 tab at breakfast, 1 at lunch, 1 at bedtime Day 5: 1 tab at breakfast & 1 tab at bedtime Day 6: 1 tab at breakfast   From your responses in the eVisit questionnaire you describe inflammation in the upper respiratory tract which is causing a significant cough.  This is commonly called Bronchitis and has four common causes:    Allergies  Viral Infections  Acid Reflux  Bacterial Infection Allergies, viruses and acid reflux are treated by controlling symptoms or eliminating the cause. An example might be a cough  caused by taking certain blood pressure medications. You stop the cough by changing the medication. Another example might be a cough caused by acid reflux. Controlling the reflux helps control the cough.  USE OF BRONCHODILATOR ("RESCUE") INHALERS: There is a risk from using your bronchodilator too frequently.  The risk is that over-reliance on a medication which only relaxes the muscles surrounding the breathing tubes can reduce the effectiveness of medications prescribed to reduce swelling and congestion of the tubes themselves.  Although you feel brief relief from the bronchodilator inhaler, your asthma may actually be worsening with the tubes becoming more swollen and filled with mucus.  This can delay other crucial treatments, such as oral steroid medications. If you need to use a bronchodilator inhaler daily, several times per day, you should discuss this with your provider.  There are probably better treatments that could be used to keep your asthma under control.     HOME CARE . Only take medications as instructed by your medical team. . Complete the entire course of an antibiotic. . Drink plenty of fluids and get plenty of rest. . Avoid close contacts especially the very young and the elderly . Cover your mouth if you cough or cough into your sleeve. . Always remember to wash your hands . A steam or ultrasonic humidifier can help congestion.   GET HELP RIGHT AWAY IF: .  You develop worsening fever. . You become short of breath . You cough up blood. . Your symptoms persist after you have completed your treatment plan MAKE SURE YOU   Understand these instructions.  Will watch your condition.  Will get help right away if you are not doing well or get worse.  Your e-visit answers were reviewed by a board certified advanced clinical practitioner to complete your personal care plan.  Depending on the condition, your plan could have included both over the counter or prescription  medications. If there is a problem please reply  once you have received a response from your provider. Your safety is important to [place].  If you have drug allergies check your prescription carefully.    You can use MyChart to ask questions about today's visit, request a non-urgent call back, or ask for a work or school excuse for 24 hours related to this e-Visit. If it has been greater than 24 hours you will need to follow up with your provider, or enter a new e-Visit to address those concerns. You will get an e-mail in the next two days asking about your experience.  I hope that your e-visit has been valuable and will speed your recovery. Thank you for using e-visits.

## 2018-03-23 DIAGNOSIS — Z13220 Encounter for screening for lipoid disorders: Secondary | ICD-10-CM | POA: Diagnosis not present

## 2018-03-23 DIAGNOSIS — Z13228 Encounter for screening for other metabolic disorders: Secondary | ICD-10-CM | POA: Diagnosis not present

## 2018-04-26 MED FILL — MELOXICAM 15 MG TABLET: 15 | 30 days supply | Qty: 30 | Fill #1

## 2018-04-26 MED FILL — VALSARTAN-HCTZ 320-25 MG TA: 320-25 | 90 days supply | Qty: 90 | Fill #1

## 2018-07-07 DIAGNOSIS — E119 Type 2 diabetes mellitus without complications: Secondary | ICD-10-CM | POA: Diagnosis not present

## 2018-07-07 DIAGNOSIS — K219 Gastro-esophageal reflux disease without esophagitis: Secondary | ICD-10-CM | POA: Diagnosis not present

## 2018-07-07 DIAGNOSIS — B001 Herpesviral vesicular dermatitis: Secondary | ICD-10-CM | POA: Diagnosis not present

## 2018-07-07 DIAGNOSIS — E785 Hyperlipidemia, unspecified: Secondary | ICD-10-CM | POA: Diagnosis not present

## 2018-07-07 DIAGNOSIS — M25519 Pain in unspecified shoulder: Secondary | ICD-10-CM | POA: Diagnosis not present

## 2018-07-07 DIAGNOSIS — I10 Essential (primary) hypertension: Secondary | ICD-10-CM | POA: Diagnosis not present

## 2018-07-07 MED FILL — VALACYCLOVIR HCL 500 MG TAB: 500 | 45 days supply | Qty: 90 | Fill #0

## 2018-07-07 MED FILL — MELOXICAM 15 MG TABLET: 15 | 30 days supply | Qty: 30 | Fill #0

## 2018-07-07 MED FILL — PANTOPRAZOLE SOD DR 40 MG T: 40 | 90 days supply | Qty: 90 | Fill #0

## 2018-07-07 MED FILL — VALSARTAN-HCTZ 320-25 MG TA: 320-25 | 30 days supply | Qty: 30 | Fill #0

## 2018-07-10 DIAGNOSIS — I10 Essential (primary) hypertension: Secondary | ICD-10-CM | POA: Diagnosis not present

## 2018-07-10 DIAGNOSIS — E785 Hyperlipidemia, unspecified: Secondary | ICD-10-CM | POA: Diagnosis not present

## 2018-07-10 DIAGNOSIS — E119 Type 2 diabetes mellitus without complications: Secondary | ICD-10-CM | POA: Diagnosis not present

## 2018-07-11 MED FILL — ROSUVASTATIN CALCIUM 10 MG: 10 | 90 days supply | Qty: 90 | Fill #0

## 2018-09-20 MED FILL — FLUCONAZOLE 150 MG TABS: 150 | 1 days supply | Qty: 1 | Fill #0

## 2018-11-15 MED FILL — VALSARTAN-HCTZ 320-25 MG TA: 320-25 | 30 days supply | Qty: 30 | Fill #0

## 2018-11-29 MED FILL — VALSARTAN-HCTZ 320-25 MG TA: 320-25 | 30 days supply | Qty: 30 | Fill #0

## 2019-01-10 DIAGNOSIS — E1169 Type 2 diabetes mellitus with other specified complication: Secondary | ICD-10-CM | POA: Diagnosis not present

## 2019-01-10 DIAGNOSIS — K219 Gastro-esophageal reflux disease without esophagitis: Secondary | ICD-10-CM | POA: Diagnosis not present

## 2019-01-10 DIAGNOSIS — E669 Obesity, unspecified: Secondary | ICD-10-CM | POA: Diagnosis not present

## 2019-01-10 DIAGNOSIS — B001 Herpesviral vesicular dermatitis: Secondary | ICD-10-CM | POA: Diagnosis not present

## 2019-01-10 DIAGNOSIS — E785 Hyperlipidemia, unspecified: Secondary | ICD-10-CM | POA: Diagnosis not present

## 2019-01-10 DIAGNOSIS — I10 Essential (primary) hypertension: Secondary | ICD-10-CM | POA: Diagnosis not present

## 2019-01-10 DIAGNOSIS — M25519 Pain in unspecified shoulder: Secondary | ICD-10-CM | POA: Diagnosis not present

## 2019-01-10 MED FILL — ROSUVASTATIN CALCIUM 10 MG: 10 | 90 days supply | Qty: 90 | Fill #0

## 2019-01-10 MED FILL — FREESTYLE LANCETS: 90 days supply | Qty: 100 | Fill #0

## 2019-01-10 MED FILL — FREESTYLE LITE METER: 30 days supply | Qty: 1 | Fill #0

## 2019-01-10 MED FILL — VALSARTAN-HCTZ 320-25 MG TA: 320-25 | 90 days supply | Qty: 90 | Fill #0

## 2019-01-10 MED FILL — FREESTYLE LITE TEST STRIP: 90 days supply | Qty: 100 | Fill #0

## 2019-04-12 DIAGNOSIS — Z13220 Encounter for screening for lipoid disorders: Secondary | ICD-10-CM | POA: Diagnosis not present

## 2019-04-12 DIAGNOSIS — Z13228 Encounter for screening for other metabolic disorders: Secondary | ICD-10-CM | POA: Diagnosis not present

## 2019-04-12 DIAGNOSIS — Z1321 Encounter for screening for nutritional disorder: Secondary | ICD-10-CM | POA: Diagnosis not present

## 2019-04-12 DIAGNOSIS — Z1231 Encounter for screening mammogram for malignant neoplasm of breast: Secondary | ICD-10-CM | POA: Diagnosis not present

## 2019-04-12 DIAGNOSIS — Z01419 Encounter for gynecological examination (general) (routine) without abnormal findings: Secondary | ICD-10-CM | POA: Diagnosis not present

## 2019-04-12 DIAGNOSIS — Z1329 Encounter for screening for other suspected endocrine disorder: Secondary | ICD-10-CM | POA: Diagnosis not present

## 2019-04-12 DIAGNOSIS — Z6831 Body mass index (BMI) 31.0-31.9, adult: Secondary | ICD-10-CM | POA: Diagnosis not present

## 2019-04-17 ENCOUNTER — Other Ambulatory Visit: Payer: Self-pay | Admitting: Obstetrics and Gynecology

## 2019-04-17 DIAGNOSIS — R928 Other abnormal and inconclusive findings on diagnostic imaging of breast: Secondary | ICD-10-CM

## 2019-04-23 ENCOUNTER — Other Ambulatory Visit: Payer: Self-pay

## 2019-04-23 ENCOUNTER — Ambulatory Visit
Admission: RE | Admit: 2019-04-23 | Discharge: 2019-04-23 | Disposition: A | Discharge status: Home / Self Care | Payer: 59 | Source: Ambulatory Visit | Attending: Obstetrics and Gynecology | Admitting: Obstetrics and Gynecology

## 2019-04-23 DIAGNOSIS — R921 Mammographic calcification found on diagnostic imaging of breast: Secondary | ICD-10-CM | POA: Diagnosis not present

## 2019-04-23 DIAGNOSIS — R928 Other abnormal and inconclusive findings on diagnostic imaging of breast: Secondary | ICD-10-CM

## 2019-06-06 DIAGNOSIS — J3489 Other specified disorders of nose and nasal sinuses: Secondary | ICD-10-CM | POA: Diagnosis not present

## 2019-06-06 DIAGNOSIS — U071 COVID-19: Secondary | ICD-10-CM | POA: Diagnosis not present

## 2019-06-06 MED FILL — FLUCONAZOLE 150 MG TABLET: 150 | 2 days supply | Qty: 2 | Fill #0

## 2019-06-06 MED FILL — VALSARTAN-HCTZ 320-25 MG TA: 320-25 | 90 days supply | Qty: 90 | Fill #0

## 2019-06-06 MED FILL — AZITHROMYCIN 250 MG TABLET: 250 | 5 days supply | Qty: 6 | Fill #0

## 2019-10-16 DIAGNOSIS — E1169 Type 2 diabetes mellitus with other specified complication: Secondary | ICD-10-CM | POA: Diagnosis not present

## 2019-10-16 DIAGNOSIS — E785 Hyperlipidemia, unspecified: Secondary | ICD-10-CM | POA: Diagnosis not present

## 2019-10-24 DIAGNOSIS — M25519 Pain in unspecified shoulder: Secondary | ICD-10-CM | POA: Diagnosis not present

## 2019-10-24 DIAGNOSIS — E785 Hyperlipidemia, unspecified: Secondary | ICD-10-CM | POA: Diagnosis not present

## 2019-10-24 DIAGNOSIS — I10 Essential (primary) hypertension: Secondary | ICD-10-CM | POA: Diagnosis not present

## 2019-10-24 DIAGNOSIS — K219 Gastro-esophageal reflux disease without esophagitis: Secondary | ICD-10-CM | POA: Diagnosis not present

## 2019-10-24 DIAGNOSIS — E1169 Type 2 diabetes mellitus with other specified complication: Secondary | ICD-10-CM | POA: Diagnosis not present

## 2019-10-24 DIAGNOSIS — E669 Obesity, unspecified: Secondary | ICD-10-CM | POA: Diagnosis not present

## 2019-10-24 DIAGNOSIS — B001 Herpesviral vesicular dermatitis: Secondary | ICD-10-CM | POA: Diagnosis not present

## 2019-10-24 MED FILL — AMLODIPINE BESYLATE 5 MG TA: 5 | 90 days supply | Qty: 90 | Fill #0

## 2019-10-24 MED FILL — MELOXICAM 15 MG TABLET: 15 | 30 days supply | Qty: 30 | Fill #0

## 2019-10-24 MED FILL — VALSARTAN-HCTZ 320-25 MG TA: 320-25 | 90 days supply | Qty: 90 | Fill #0

## 2019-10-24 MED FILL — VALACYCLOVIR HCL 500 MG TAB: 500 | 45 days supply | Qty: 90 | Fill #0

## 2019-10-24 MED FILL — ROSUVASTATIN CALCIUM 10 MG: 10 | 90 days supply | Qty: 90 | Fill #0

## 2019-11-26 MED FILL — METOPROLOL SUCCINATE ER 50: 50 | 30 days supply | Qty: 30 | Fill #0

## 2020-01-11 ENCOUNTER — Other Ambulatory Visit (HOSPITAL_COMMUNITY): Payer: Self-pay | Admitting: Family Medicine

## 2020-01-11 MED FILL — METOPROLOL SUCCINATE ER 50: 50 | 90 days supply | Qty: 90 | Fill #0

## 2020-01-11 MED FILL — VALSARTAN-HCTZ 320-25 MG TA: 320-25 | 90 days supply | Qty: 90 | Fill #0

## 2020-04-15 ENCOUNTER — Other Ambulatory Visit (HOSPITAL_COMMUNITY): Payer: Self-pay | Admitting: Family Medicine

## 2020-04-15 DIAGNOSIS — M25519 Pain in unspecified shoulder: Secondary | ICD-10-CM | POA: Diagnosis not present

## 2020-04-15 DIAGNOSIS — Z23 Encounter for immunization: Secondary | ICD-10-CM | POA: Diagnosis not present

## 2020-04-15 DIAGNOSIS — K219 Gastro-esophageal reflux disease without esophagitis: Secondary | ICD-10-CM | POA: Diagnosis not present

## 2020-04-15 DIAGNOSIS — E669 Obesity, unspecified: Secondary | ICD-10-CM | POA: Diagnosis not present

## 2020-04-15 DIAGNOSIS — E1169 Type 2 diabetes mellitus with other specified complication: Secondary | ICD-10-CM | POA: Diagnosis not present

## 2020-04-15 DIAGNOSIS — B001 Herpesviral vesicular dermatitis: Secondary | ICD-10-CM | POA: Diagnosis not present

## 2020-04-15 DIAGNOSIS — Z1211 Encounter for screening for malignant neoplasm of colon: Secondary | ICD-10-CM | POA: Diagnosis not present

## 2020-04-15 DIAGNOSIS — I10 Essential (primary) hypertension: Secondary | ICD-10-CM | POA: Diagnosis not present

## 2020-04-15 DIAGNOSIS — E785 Hyperlipidemia, unspecified: Secondary | ICD-10-CM | POA: Diagnosis not present

## 2020-04-15 DIAGNOSIS — Z0000 Encounter for general adult medical examination without abnormal findings: Secondary | ICD-10-CM | POA: Diagnosis not present

## 2020-04-15 MED FILL — VALSARTAN-HCTZ 320-25 MG TA: 320-25 | 90 days supply | Qty: 90 | Fill #0

## 2020-04-15 MED FILL — MELOXICAM 15 MG TABLET: 15 | 30 days supply | Qty: 30 | Fill #0

## 2020-04-15 MED FILL — VALACYCLOVIR HCL 500 MG TAB: 500 | 45 days supply | Qty: 90 | Fill #0

## 2020-04-15 MED FILL — ROSUVASTATIN CALCIUM 10 MG: 10 | 90 days supply | Qty: 90 | Fill #0

## 2020-04-15 MED FILL — METOPROLOL SUCCINATE ER 50: 50 | 90 days supply | Qty: 90 | Fill #0

## 2020-04-23 DIAGNOSIS — Z01419 Encounter for gynecological examination (general) (routine) without abnormal findings: Secondary | ICD-10-CM | POA: Diagnosis not present

## 2020-04-23 DIAGNOSIS — Z1231 Encounter for screening mammogram for malignant neoplasm of breast: Secondary | ICD-10-CM | POA: Diagnosis not present

## 2020-04-23 DIAGNOSIS — Z6835 Body mass index (BMI) 35.0-35.9, adult: Secondary | ICD-10-CM | POA: Diagnosis not present

## 2020-05-08 ENCOUNTER — Emergency Department (HOSPITAL_COMMUNITY)
Admission: EM | Admit: 2020-05-08 | Discharge: 2020-05-08 | Disposition: A | Discharge status: Home / Self Care | Payer: PRIVATE HEALTH INSURANCE | Attending: Emergency Medicine | Admitting: Emergency Medicine

## 2020-05-08 ENCOUNTER — Emergency Department (HOSPITAL_COMMUNITY): Payer: PRIVATE HEALTH INSURANCE

## 2020-05-08 ENCOUNTER — Encounter (HOSPITAL_COMMUNITY): Payer: Self-pay | Admitting: Emergency Medicine

## 2020-05-08 DIAGNOSIS — S0093XA Contusion of unspecified part of head, initial encounter: Secondary | ICD-10-CM | POA: Insufficient documentation

## 2020-05-08 DIAGNOSIS — R55 Syncope and collapse: Secondary | ICD-10-CM | POA: Diagnosis not present

## 2020-05-08 DIAGNOSIS — W228XXA Striking against or struck by other objects, initial encounter: Secondary | ICD-10-CM | POA: Insufficient documentation

## 2020-05-08 DIAGNOSIS — R42 Dizziness and giddiness: Secondary | ICD-10-CM | POA: Diagnosis not present

## 2020-05-08 DIAGNOSIS — R519 Headache, unspecified: Secondary | ICD-10-CM | POA: Diagnosis not present

## 2020-05-08 DIAGNOSIS — S0990XA Unspecified injury of head, initial encounter: Secondary | ICD-10-CM

## 2020-05-08 LAB — CBC WITH DIFFERENTIAL/PLATELET
Abs Immature Granulocytes: 0.02 10*3/uL (ref 0.00–0.07)
Basophils Absolute: 0 10*3/uL (ref 0.0–0.1)
Basophils Relative: 1 %
Eosinophils Absolute: 0.1 10*3/uL (ref 0.0–0.5)
Eosinophils Relative: 1 %
HCT: 36.8 % (ref 36.0–46.0)
Hemoglobin: 12.5 g/dL (ref 12.0–15.0)
Immature Granulocytes: 0 %
Lymphocytes Relative: 24 %
Lymphs Abs: 1.7 10*3/uL (ref 0.7–4.0)
MCH: 29.6 pg (ref 26.0–34.0)
MCHC: 34 g/dL (ref 30.0–36.0)
MCV: 87.2 fL (ref 80.0–100.0)
Monocytes Absolute: 0.4 10*3/uL (ref 0.1–1.0)
Monocytes Relative: 6 %
Neutro Abs: 4.8 10*3/uL (ref 1.7–7.7)
Neutrophils Relative %: 68 %
Platelets: 197 10*3/uL (ref 150–400)
RBC: 4.22 MIL/uL (ref 3.87–5.11)
RDW: 12.9 % (ref 11.5–15.5)
WBC: 7 10*3/uL (ref 4.0–10.5)
nRBC: 0 % (ref 0.0–0.2)

## 2020-05-08 LAB — BASIC METABOLIC PANEL
Anion gap: 12 (ref 5–15)
BUN: 16 mg/dL (ref 6–20)
CO2: 23 mmol/L (ref 22–32)
Calcium: 9.2 mg/dL (ref 8.9–10.3)
Chloride: 102 mmol/L (ref 98–111)
Creatinine, Ser: 0.67 mg/dL (ref 0.44–1.00)
GFR, Estimated: >60 mL/min (ref >60)
Glucose, Bld: 159 mg/dL — ABNORMAL HIGH (ref 70–99)
Potassium: 3.7 mmol/L (ref 3.5–5.1)
Sodium: 137 mmol/L (ref 135–145)

## 2020-05-08 LAB — CBG MONITORING, ED: Glucose-Capillary: 179 mg/dL — ABNORMAL HIGH (ref 70–99)

## 2020-05-08 MED ORDER — DEXTROSE-NACL 5-0.9 % IV SOLN: INTRAVENOUS | Status: DC

## 2020-05-08 MED ORDER — ACETAMINOPHEN 500 MG PO TABS
1000.0000 mg | ORAL_TABLET | Freq: Once | ORAL | Status: AC
  Administered 2020-05-08: 1000 mg via ORAL
  Filled 2020-05-08: qty 2

## 2020-05-08 NOTE — ED Triage Notes (Signed)
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 Witnessed fall hit back on her head ,left side.  Witnesses state "possible seizure like activity, head twitching and pointing."   On arrival to ED pt alert and oriented. Pale and jittery. RRT states pt became very lightheaded both time they attempted to sit her up.

## 2020-05-08 NOTE — ED Provider Notes (Signed)
[facility] EMERGENCY DEPARTMENT Provider Note   CSN: [id number] Arrival date & time: 05/08/20  7829     History Chief Complaint  Patient presents with  . Loss of Consciousness    Jaime Rhodes is a 45 y.o. female.  She is a history of hypertension.  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  She was standing in her workstation when she said she felt very hungry and had hunger pains in her abdomen.  Staff noticed her to fall striking her head.  Few beats of some clonic activity.  Briefly unconscious.  Rapid response was called and initial responders found her to be awake and alert.  They tried to set her up but she became lightheaded again.  Gave her some oral soda and started IV.  No vital signs or blood sugar taken.  Patient is awake and alert.  Complaining of sharp pain in the back of her head from hitting her head.  States has not eaten since 3 PM yesterday.  No recent illness.  One prior syncopal event in the past.  No history of seizures.  The history is provided by the patient (hospital staff).  Loss of Consciousness Episode history:  Single Most recent episode:  Today Progression:  Resolved Chronicity:  New Context: normal activity   Witnessed: yes   Relieved by:  Lying down Ineffective treatments:  None tried Associated symptoms: headaches   Associated symptoms: no chest pain, no difficulty breathing, no fever, no focal sensory loss, no focal weakness, no nausea, no shortness of breath and no vomiting        Past Medical History:  Diagnosis Date  . GERD (gastroesophageal reflux disease)    treated prn with nexium-will take dos  . Hypertension     There are no problems to display for this patient.   Past Surgical History:  Procedure Laterality Date  . LAPAROSCOPIC ASSISTED VAGINAL HYSTERECTOMY  02/22/2012   Procedure: LAPAROSCOPIC ASSISTED VAGINAL HYSTERECTOMY;  Surgeon: Margarette Asal, MD;  Location: [facility];  Service: Gynecology;  Laterality: N/A;   . WISDOM TOOTH EXTRACTION       OB History   No obstetric history on file.     History reviewed. No pertinent family history.  Social History   Tobacco Use  . Smoking status: Never Smoker  . Smokeless tobacco: Never Used  Vaping Use  . Vaping Use: Never used  Substance Use Topics  . Alcohol use: No  . Drug use: No    Home Medications Prior to Admission medications   Medication Sig Start Date End Date Taking? Authorizing Provider  Albuterol Sulfate (PROAIR RESPICLICK) 562 (90 Base) MCG/ACT AEPB Inhale 2 puffs into the lungs every 6 (six) hours as needed (shortness of breath). 03/17/18   Withrow, Elyse Jarvis, FNP  azithromycin (ZITHROMAX) 250 MG tablet Take 2 tabs now then 1 daily times 4 days 03/17/18   Withrow, Elyse Jarvis, FNP  benzonatate (TESSALON PERLES) 100 MG capsule Take 1-2 capsules (100-200 mg total) by mouth every 8 (eight) hours as needed for cough. 03/17/18   Withrow, Elyse Jarvis, FNP  cetirizine (ZYRTEC) 10 MG tablet Can take one tablet by mouth one to two times daily as directed. 06/01/17   Kozlow, Donnamarie Poag, MD  ibuprofen (ADVIL,MOTRIN) 800 MG tablet Take 1 tablet (800 mg total) by mouth every 8 (eight) hours as needed (mild pain). 02/23/12   Molli Posey, MD  mometasone (ELOCON) 0.1 % cream Apply to skin lesion twice daily until resolved. 06/01/17  Kozlow, Donnamarie Poag, MD  predniSONE (DELTASONE) 5 MG tablet Take 1 tablet (5 mg total) by mouth as directed. Taper 6,5,4,3,2,1 03/17/18   Withrow, Elyse Jarvis, FNP  valACYclovir (VALTREX) 500 MG tablet Take 500 mg by mouth daily as needed. For fever blister    [provider]  valsartan-hydrochlorothiazide (DIOVAN-HCT) 320-25 MG per tablet Take 1 tablet by mouth daily.    [provider]    Allergies    Penicillins  Review of Systems   Review of Systems  Constitutional: Negative for fever.  HENT: Negative for sore throat.   Eyes: Negative for visual disturbance.  Respiratory: Negative for shortness of breath.    Cardiovascular: Positive for syncope. Negative for chest pain.  Gastrointestinal: Negative for abdominal pain, nausea and vomiting.  Genitourinary: Negative for dysuria.  Musculoskeletal: Negative for neck pain.  Skin: Negative for rash.  Neurological: Positive for headaches. Negative for focal weakness and syncope.    Physical Exam Updated Vital Signs BP (!) 140/93   Pulse 86   Temp 97.6 F (36.4 C) (Oral)   Resp 16   Ht 5\' 7"  (1.702 m)   Wt 99.3 kg   LMP 01/23/2012   SpO2 100%   BMI 34.30 kg/m   Physical Exam Vitals and nursing note reviewed.  Constitutional:      General: She is not in acute distress.    Appearance: Normal appearance. She is well-developed and well-nourished.  HENT:     Head: Normocephalic.     Comments: Small tender hematoma posterior head Eyes:     Conjunctiva/sclera: Conjunctivae normal.  Cardiovascular:     Rate and Rhythm: Normal rate and regular rhythm.     Heart sounds: No murmur heard.   Pulmonary:     Effort: Pulmonary effort is normal. No respiratory distress.     Breath sounds: Normal breath sounds.  Abdominal:     Palpations: Abdomen is soft.     Tenderness: There is no abdominal tenderness.  Musculoskeletal:        General: No deformity, signs of injury or edema. Normal range of motion.     Cervical back: Neck supple. No tenderness.  Skin:    General: Skin is warm and dry.     Capillary Refill: Capillary refill takes less than 2 seconds.  Neurological:     General: No focal deficit present.     Mental Status: She is alert and oriented to person, place, and time.     Sensory: No sensory deficit.     Motor: No weakness.  Psychiatric:        Mood and Affect: Mood and affect normal.     ED Results / Procedures / Treatments   Labs (all labs ordered are listed, but only abnormal results are displayed) Labs Reviewed  BASIC METABOLIC PANEL - Abnormal; Notable for the following components:      Result Value   Glucose, Bld 159  (*)    All other components within normal limits  CBG MONITORING, ED - Abnormal; Notable for the following components:   Glucose-Capillary 179 (*)    All other components within normal limits  CBC WITH DIFFERENTIAL/PLATELET  CBC WITH DIFFERENTIAL/PLATELET    EKG EKG Interpretation  Date/Time:  Thursday May 08 2020 08:16:51 EST Ventricular Rate:  83 PR Interval:    QRS Duration: 96 QT Interval:  394 QTC Calculation: 463 R Axis:   62 Text Interpretation: Sinus rhythm Abnormal R-wave progression, early transition No significant change since prior 9/13 Confirmed  by Aletta Edouard [phone]) on 05/08/2020 8:23:58 AM   Radiology CT HEAD WO CONTRAST  Result Date: 05/08/2020 CLINICAL DATA:  Syncope with fall.  Headache and dizziness EXAM: CT HEAD WITHOUT CONTRAST TECHNIQUE: Contiguous axial images were obtained from the base of the skull through the vertex without intravenous contrast. COMPARISON:  None. FINDINGS: Brain: Ventricles and sulci are normal in size and configuration. There is invagination of CSF into the sella. There is no intracranial mass, hemorrhage, extra-axial fluid collection, midline shift. Brain parenchyma appears unremarkable. No findings indicative of acute infarct. Vascular: No hyperdense vessel.  No evident vascular calcification. Skull: Bony calvarium appears intact. Sinuses/Orbits: There is opacification of visualized left maxillary antrum. There is mucosal thickening in the right maxillary antrum. Orbits appear symmetric bilaterally. Other: Mastoid air cells are clear. IMPRESSION: 1. Normal appearing brain parenchyma. No mass, hemorrhage, or extra-axial fluid collection. Invagination of CSF into the sella is consistent with a degree of empty sella, a finding of questionable clinical significance. 2. Maxillary sinus disease, more severe on the left than on the right. Electronically Signed   By: Lowella Grip III M.D.   On: 05/08/2020 09:01    Procedures Procedures    Medications Ordered in ED Medications  acetaminophen (TYLENOL) tablet 1,000 mg (1,000 mg Oral Given 05/08/20 1025)    ED Course  I have reviewed the triage vital signs and the nursing notes.  Pertinent labs & imaging results that were available during my care of the patient were reviewed by me and considered in my medical decision making (see chart for details).  Clinical Course as of 05/09/20 0843  Thu May 08, 2020  0854 CT interpreted by me as no acute findings. [MB]  1009 Patient ambulated in the department without any dizziness lightheadedness.  She said she has a mild headache and I have ordered her some Tylenol.  I also asked if they can get her something to eat.  Her husband is here and I updated them both on her results. [MB]    Clinical Course User Index [MB] Hayden Rasmussen, MD   MDM Rules/Calculators/A&P                         This patient complains of syncopal event headache; this involves an extensive number of treatment Options and is a complaint that carries with it a high risk of complications and Morbidity. The differential includes syncope, vasovagal, arrhythmia, head bleed, skull fracture, hypoglycemia, metabolic derangement, anemia  I ordered, reviewed and interpreted labs, which included CBC with normal white count normal hemoglobin, chemistries normal other than elevated glucose I ordered medication oral Tylenol and IV fluids I ordered imaging studies which included head CT and I independently    visualized and interpreted imaging which showed no acute findings Additional history obtained from rapid response team and Dr. Haroldine Laws Previous records obtained and reviewed in epic, no recent admissions  After the interventions stated above, I reevaluated the patient and found patient to be asymptomatic.  Headache improved.  Ambulated in department without any difficulty.  Reviewed work-up with her and her husband and return instructions discussed.   Final  Clinical Impression(s) / ED Diagnoses Final diagnoses:  Syncope, unspecified syncope type  Injury of head, initial encounter    Rx / DC Orders ED Discharge Orders    None       Hayden Rasmussen, MD 05/09/20 [phone]

## 2020-05-08 NOTE — ED Notes (Signed)
Pt transported to CT ?

## 2020-05-08 NOTE — Discharge Instructions (Signed)
You were seen in the emergency department for a syncopal event while at work.  You had blood work EKG and a CAT scan of your head that did not show any acute abnormalities.  This may be related to some dehydration and not eating.  Please follow-up with your regular doctor.  Return to the emergency department for any worsening or concerning symptoms.

## 2020-05-13 DIAGNOSIS — S060X9D Concussion with loss of consciousness of unspecified duration, subsequent encounter: Secondary | ICD-10-CM | POA: Diagnosis not present

## 2020-05-13 DIAGNOSIS — R9389 Abnormal findings on diagnostic imaging of other specified body structures: Secondary | ICD-10-CM | POA: Diagnosis not present

## 2020-05-15 DIAGNOSIS — E236 Other disorders of pituitary gland: Secondary | ICD-10-CM | POA: Diagnosis not present

## 2020-05-19 ENCOUNTER — Other Ambulatory Visit (HOSPITAL_COMMUNITY): Payer: Self-pay | Admitting: Physician Assistant

## 2020-05-19 DIAGNOSIS — R9389 Abnormal findings on diagnostic imaging of other specified body structures: Secondary | ICD-10-CM | POA: Diagnosis not present

## 2020-05-19 DIAGNOSIS — S060X9D Concussion with loss of consciousness of unspecified duration, subsequent encounter: Secondary | ICD-10-CM | POA: Diagnosis not present

## 2020-05-19 DIAGNOSIS — J019 Acute sinusitis, unspecified: Secondary | ICD-10-CM | POA: Diagnosis not present

## 2020-05-19 DIAGNOSIS — R55 Syncope and collapse: Secondary | ICD-10-CM | POA: Diagnosis not present

## 2020-05-19 MED FILL — AZITHROMYCIN 250 MG TABLET: 250 | 5 days supply | Qty: 6 | Fill #0

## 2020-05-27 ENCOUNTER — Other Ambulatory Visit (HOSPITAL_COMMUNITY): Payer: Self-pay | Admitting: Physician Assistant

## 2020-05-27 DIAGNOSIS — M542 Cervicalgia: Secondary | ICD-10-CM | POA: Diagnosis not present

## 2020-05-27 DIAGNOSIS — I10 Essential (primary) hypertension: Secondary | ICD-10-CM | POA: Diagnosis not present

## 2020-05-27 DIAGNOSIS — R9389 Abnormal findings on diagnostic imaging of other specified body structures: Secondary | ICD-10-CM | POA: Diagnosis not present

## 2020-05-27 DIAGNOSIS — R55 Syncope and collapse: Secondary | ICD-10-CM | POA: Diagnosis not present

## 2020-05-27 DIAGNOSIS — S060X9S Concussion with loss of consciousness of unspecified duration, sequela: Secondary | ICD-10-CM | POA: Diagnosis not present

## 2020-05-27 DIAGNOSIS — J019 Acute sinusitis, unspecified: Secondary | ICD-10-CM | POA: Diagnosis not present

## 2020-05-31 ENCOUNTER — Other Ambulatory Visit: Payer: Self-pay | Admitting: Physician Assistant

## 2020-05-31 DIAGNOSIS — M542 Cervicalgia: Secondary | ICD-10-CM

## 2020-06-10 ENCOUNTER — Encounter: Payer: Self-pay | Admitting: Neurology

## 2020-06-11 ENCOUNTER — Ambulatory Visit
Admission: RE | Admit: 2020-06-11 | Discharge: 2020-06-11 | Disposition: A | Discharge status: Home / Self Care | Payer: 59 | Source: Ambulatory Visit | Attending: Physician Assistant | Admitting: Physician Assistant

## 2020-06-11 ENCOUNTER — Other Ambulatory Visit: Payer: Self-pay | Admitting: Physician Assistant

## 2020-06-11 ENCOUNTER — Other Ambulatory Visit: Payer: Self-pay

## 2020-06-11 DIAGNOSIS — M47812 Spondylosis without myelopathy or radiculopathy, cervical region: Secondary | ICD-10-CM | POA: Diagnosis not present

## 2020-06-11 DIAGNOSIS — M542 Cervicalgia: Secondary | ICD-10-CM

## 2020-06-11 DIAGNOSIS — M4312 Spondylolisthesis, cervical region: Secondary | ICD-10-CM | POA: Diagnosis not present

## 2020-06-11 DIAGNOSIS — M25511 Pain in right shoulder: Secondary | ICD-10-CM | POA: Diagnosis not present

## 2020-06-11 MED ORDER — GADOBENATE DIMEGLUMINE 529 MG/ML IV SOLN
20.0000 mL | Freq: Once | INTRAVENOUS | Status: AC | PRN
  Administered 2020-06-11: 20 mL via INTRAVENOUS

## 2020-06-18 ENCOUNTER — Other Ambulatory Visit: Payer: Self-pay | Admitting: Physician Assistant

## 2020-06-18 DIAGNOSIS — R9389 Abnormal findings on diagnostic imaging of other specified body structures: Secondary | ICD-10-CM

## 2020-06-18 DIAGNOSIS — E236 Other disorders of pituitary gland: Secondary | ICD-10-CM

## 2020-06-23 ENCOUNTER — Other Ambulatory Visit: Payer: 59

## 2020-07-01 ENCOUNTER — Other Ambulatory Visit: Payer: Self-pay

## 2020-07-01 ENCOUNTER — Ambulatory Visit
Admission: RE | Admit: 2020-07-01 | Discharge: 2020-07-01 | Disposition: A | Discharge status: Home / Self Care | Payer: 59 | Source: Ambulatory Visit | Attending: Physician Assistant | Admitting: Physician Assistant

## 2020-07-01 DIAGNOSIS — R55 Syncope and collapse: Secondary | ICD-10-CM | POA: Diagnosis not present

## 2020-07-01 DIAGNOSIS — R9389 Abnormal findings on diagnostic imaging of other specified body structures: Secondary | ICD-10-CM

## 2020-07-01 DIAGNOSIS — R519 Headache, unspecified: Secondary | ICD-10-CM | POA: Diagnosis not present

## 2020-07-01 DIAGNOSIS — E236 Other disorders of pituitary gland: Secondary | ICD-10-CM

## 2020-07-01 MED ORDER — GADOBENATE DIMEGLUMINE 529 MG/ML IV SOLN
20.0000 mL | Freq: Once | INTRAVENOUS | Status: AC | PRN
  Administered 2020-07-01: 20 mL via INTRAVENOUS

## 2020-07-07 DIAGNOSIS — H6121 Impacted cerumen, right ear: Secondary | ICD-10-CM | POA: Diagnosis not present

## 2020-07-07 DIAGNOSIS — J302 Other seasonal allergic rhinitis: Secondary | ICD-10-CM | POA: Diagnosis not present

## 2020-07-07 DIAGNOSIS — E236 Other disorders of pituitary gland: Secondary | ICD-10-CM | POA: Diagnosis not present

## 2020-07-15 ENCOUNTER — Ambulatory Visit: Payer: 59 | Admitting: Interventional Cardiology

## 2020-07-15 ENCOUNTER — Other Ambulatory Visit: Payer: Self-pay

## 2020-07-15 ENCOUNTER — Encounter: Payer: Self-pay | Admitting: Interventional Cardiology

## 2020-07-15 VITALS — BP 112/82 | HR 92 | Ht 67.0 in | Wt 223.8 lb

## 2020-07-15 DIAGNOSIS — E119 Type 2 diabetes mellitus without complications: Secondary | ICD-10-CM | POA: Diagnosis not present

## 2020-07-15 DIAGNOSIS — I10 Essential (primary) hypertension: Secondary | ICD-10-CM | POA: Diagnosis not present

## 2020-07-15 DIAGNOSIS — R072 Precordial pain: Secondary | ICD-10-CM | POA: Diagnosis not present

## 2020-07-15 DIAGNOSIS — R55 Syncope and collapse: Secondary | ICD-10-CM

## 2020-07-15 LAB — BASIC METABOLIC PANEL
BUN/Creatinine Ratio: 22 (ref 9–23)
BUN: 16 mg/dL (ref 6–24)
CO2: 23 mmol/L (ref 20–29)
Calcium: 9.6 mg/dL (ref 8.7–10.2)
Chloride: 100 mmol/L (ref 96–106)
Creatinine, Ser: 0.72 mg/dL (ref 0.57–1.00)
Glucose: 131 mg/dL — ABNORMAL HIGH (ref 65–99)
Potassium: 3.9 mmol/L (ref 3.5–5.2)
Sodium: 139 mmol/L (ref 134–144)
eGFR: 105 mL/min/{1.73_m2} (ref >59)

## 2020-07-15 NOTE — Progress Notes (Signed)
Cardiology Office Note   Date:  07/15/2020   ID:  Jaime Rhodes, DOB 04-23-1975, MRN [id number]  PCP:  Lois Huxley, PA    No chief complaint on file.  Syncope/dizziness/headaches  Wt Readings from Last 3 Encounters:  07/15/20 223 lb 12.8 oz (101.5 kg)  05/08/20 219 lb (99.3 kg)  06/01/17 214 lb 6.4 oz (97.3 kg)       History of Present Illness: Jaime Rhodes is a 45 y.o. female  With HTN, DM who has had syncope on 05/08/20.  FH included MI in 47 year old sister, requiring arctic sun protocol.  Mother had CABG and valve surgery.    She works at [employer].  She had syncope several years ago.  She had a prodrome of feeling bad and then passed out.  In 2/24, she had a chest pressure and then passed out.  No associated sweating or nausea.  It lasted several minutes.  She reached down to pick something up and then passed out.  Her coworkers told her she was out for a few minutes.  She was told she was dehydrated in the ER.    Metoprolol was added for BP 4-5 months prior to syncope.   Diastolics improved with metoprolol.  Turning to the left causes some dizziness.   Denies : Leg edema. Nitroglycerin use. Orthopnea. Palpitations. Paroxysmal nocturnal dyspnea. Shortness of breath.      Past Medical History:  Diagnosis Date  . Acne rosacea   . GERD (gastroesophageal reflux disease)    treated prn with nexium-will take dos  . Herpes labialis   . Hypertension   . T2DM (type 2 diabetes mellitus) ([place])     Past Surgical History:  Procedure Laterality Date  . LAPAROSCOPIC ASSISTED VAGINAL HYSTERECTOMY  02/22/2012   Procedure: LAPAROSCOPIC ASSISTED VAGINAL HYSTERECTOMY;  Surgeon: Margarette Asal, MD;  Location: [facility];  Service: Gynecology;  Laterality: N/A;  . WISDOM TOOTH EXTRACTION       Current Outpatient Medications  Medication Sig Dispense Refill  . Albuterol Sulfate (PROAIR RESPICLICK) 124 (90 Base) MCG/ACT AEPB Inhale 2 puffs into the lungs  every 6 (six) hours as needed (shortness of breath). 1 each 2  . azithromycin (ZITHROMAX) 250 MG tablet Take 2 tabs now then 1 daily times 4 days 6 tablet 0  . azithromycin (ZITHROMAX) 250 MG tablet TAKE 2 TABLETS BY MOUTH ON DAY 1 THEN TAKE 1 TABLET DAILY FOR THE NEXT 4 DAYS. (Patient taking differently: Take by mouth daily. for the next 4 days) 6 tablet 0  . benzonatate (TESSALON PERLES) 100 MG capsule Take 1-2 capsules (100-200 mg total) by mouth every 8 (eight) hours as needed for cough. 30 capsule 0  . cetirizine (ZYRTEC) 10 MG tablet Can take one tablet by mouth one to two times daily as directed. 60 tablet 5  . doxycycline (VIBRAMYCIN) 100 MG capsule TAKE 1 CAPSULE BY MOUTH 2 TIMES A DAY FOR 10 DAYS 20 capsule 0  . fluconazole (DIFLUCAN) 150 MG tablet TAKE 1 TABLET BY MOUTH ONCE. MAY TAKE 2ND TABLET IF SYMPTOMS APPEAR AGAIN AFTER 72 HOURS 2 tablet 0  . ibuprofen (ADVIL,MOTRIN) 800 MG tablet Take 1 tablet (800 mg total) by mouth every 8 (eight) hours as needed (mild pain). 30 tablet 2  . meloxicam (MOBIC) 15 MG tablet TAKE 1 TABLET BY MOUTH ONCE A DAY AS NEEDED 30 tablet 1  . metoprolol succinate (TOPROL-XL) 50 MG 24 hr tablet TAKE 1 TABLET  BY MOUTH ONCE DAILY 90 tablet 1  . metoprolol succinate (TOPROL-XL) 50 MG 24 hr tablet TAKE 1 TABLET BY MOUTH ONCE DAILY. 90 tablet 1  . mometasone (ELOCON) 0.1 % cream Apply to skin lesion twice daily until resolved. 90 g 5  . predniSONE (DELTASONE) 5 MG tablet Take 1 tablet (5 mg total) by mouth as directed. Taper 6,5,4,3,2,1 21 tablet 0  . rosuvastatin (CRESTOR) 10 MG tablet TAKE 1 TABLET BY MOUTH AT BEDTIME 90 tablet 1  . valACYclovir (VALTREX) 500 MG tablet Take 500 mg by mouth daily as needed. For fever blister    . valACYclovir (VALTREX) 500 MG tablet TAKE 1 TABLET BY MOUTH EVERY 12 HOURS AS NEEDED FOR FEVER BLISTER 90 tablet 0  . valsartan-hydrochlorothiazide (DIOVAN-HCT) 320-25 MG per tablet Take 1 tablet by mouth daily.    .  valsartan-hydrochlorothiazide (DIOVAN-HCT) 320-25 MG tablet TAKE 1 TABLET BY MOUTH ONCE A DAY 90 tablet 1  . valsartan-hydrochlorothiazide (DIOVAN-HCT) 320-25 MG tablet TAKE 1 TABLET BY MOUTH ONCE DAILY. 90 tablet 1   No current facility-administered medications for this visit.    Allergies:   Penicillins    Social History:  The patient  reports that she has never smoked. She has never used smokeless tobacco. She reports that she does not drink alcohol and does not use drugs.   Family History:  The patient's family history includes Heart attack in her sister; Heart disease in her sister.    ROS:  Please see the history of present illness.   Otherwise, review of systems are positive for syncope- dizziness, headaches.   All other systems are reviewed and negative.    PHYSICAL EXAM: VS:  BP 112/82   Pulse 92   Ht 5\' 7"  (1.702 m)   Wt 223 lb 12.8 oz (101.5 kg)   LMP 01/23/2012   SpO2 98%   BMI 35.05 kg/m  , BMI Body mass index is 35.05 kg/m. GEN: Well nourished, well developed, in no acute distress  HEENT: normal  Neck: no JVD, carotid bruits, or masses Cardiac: RRR; no murmurs, rubs, or gallops,no edema  Respiratory:  clear to auscultation bilaterally, normal work of breathing GI: soft, nontender, nondistended, + BS MS: no deformity or atrophy ; 2+ PT pulses Skin: warm and dry, no rash Neuro:  Strength and sensation are intact Psych: euthymic mood, full affect   EKG:   The ekg ordered 05/08/20 demonstrates NSR, no ST changes   Recent Labs: 05/08/2020: BUN 16; Creatinine, Ser 0.67; Hemoglobin 12.5; Platelets 197; Potassium 3.7; Sodium 137   Lipid Panel No results found for: CHOL, TRIG, HDL, CHOLHDL, VLDL, LDLCALC, LDLDIRECT   Other studies Reviewed: Additional studies/ records that were reviewed today with results demonstrating: ER records and blood tests revewed.   ASSESSMENT AND PLAN:  1. Syncope: Taking time to change positions, particularly going from sitting to  standing,Or lying down to standing is important.  Could consider rhythm monitoring but I think bradycardia as a cause would be very unlikely.  Plan for echo. Stay hydrated.  2. Chest pressure: Precordial chest pain.  Given RF for CAD and family h/o CAD, will plan for CTA.  3. HTN: The current medical regimen is effective;  continue present plan and medications. 4. Hyperlipidemia: LDL 99 in 2/22. Continue rosuvastatin at curent dose.    Current medicines are reviewed at length with the patient today.  The patient concerns regarding her medicines were addressed.  The following changes have been made:  No change  Labs/ tests ordered today include: for [facility] No orders of the defined types were placed in this encounter.   Recommend 150 minutes/week of aerobic exercise Low fat, low carb, high fiber diet recommended  Disposition:   FU based on results   Signed, Larae Grooms, MD  07/15/2020 9:59 AM    [facility] [address], [place], [place]  [address] Phone: [phone]; Fax: [phone]

## 2020-07-15 NOTE — Patient Instructions (Signed)
Medication Instructions:  Your physician recommends that you continue on your current medications as directed. Please refer to the Current Medication list given to you today.  *If you need a refill on your cardiac medications before your next appointment, please call your pharmacy*   Lab Work: Lab work to be done today--BMP If you have labs (blood work) drawn today and your tests are completely normal, you will receive your results only by: Marland Kitchen MyChart Message (if you have MyChart) OR . A paper copy in the mail If you have any lab test that is abnormal or we need to change your treatment, we will call you to review the results.   Testing/Procedures: Your physician has requested that you have an echocardiogram. Echocardiography is a painless test that uses sound waves to create images of your heart. It provides your doctor with information about the size and shape of your heart and how well your heart's chambers and valves are working. This procedure takes approximately one hour. There are no restrictions for this procedure.  Your physician has requested that you have cardiac CT. Cardiac computed tomography (CT) is a painless test that uses an x-ray machine to take clear, detailed pictures of your heart. For further information please visit [email or web address]. Please follow instruction sheet as given.      Follow-Up: At [facility], you and your health needs are our priority.  As part of our continuing mission to provide you with exceptional heart care, we have created designated Provider Care Teams.  These Care Teams include your primary Cardiologist (physician) and Advanced Practice Providers (APPs -  Physician Assistants and Nurse Practitioners) who all work together to provide you with the care you need, when you need it.  We recommend signing up for the patient portal called "MyChart".  Sign up information is provided on this After Visit Summary.  MyChart is used to connect with  patients for Virtual Visits (Telemedicine).  Patients are able to view lab/test results, encounter notes, upcoming appointments, etc.  Non-urgent messages can be sent to your provider as well.   To learn more about what you can do with MyChart, go to [email or web address].    Your next appointment:   Based on test results  The format for your next appointment:   In Person  Provider:   You may see Larae Grooms, MDor one of the following Advanced Practice Providers on your designated Care Team:    Melina Copa, PA-C  Ermalinda Barrios, PA-C    Other Instructions  Your cardiac CT will be scheduled at one of the below locations:   [facility] [address] [place], [place] [address] [phone]  [place] [address] [place], [place] [address] [phone]  If scheduled at [facility], please arrive at the [facility] main entrance (entrance A) of [facility] 30 minutes prior to test start time. Proceed to the [facility] Radiology Department (first floor) to check-in and test prep.  If scheduled at [facility], please arrive 15 mins early for check-in and test prep.  Please follow these instructions carefully (unless otherwise directed):    On the Night Before the Test: . Be sure to Drink plenty of water. . Do not consume any caffeinated/decaffeinated beverages or chocolate 12 hours prior to your test. . Do not take any antihistamines 12 hours prior to your test.  On the Day of the Test: . Drink plenty of water  until 1 hour prior to the test. . Do not eat any food 4 hours prior to the test. . You may take your regular medications prior to the test.  . Take 2 of your Toprol tablets by mouth 2 hours prior to CT Scan . HOLD Furosemide/Hydrochlorothiazide morning of the test. . FEMALES- please wear underwire-free bra if available           After the  Test: . Drink plenty of water. . After receiving IV contrast, you may experience a mild flushed feeling. This is normal. . On occasion, you may experience a mild rash up to 24 hours after the test. This is not dangerous. If this occurs, you can take Benadryl 25 mg and increase your fluid intake. . If you experience trouble breathing, this can be serious. If it is severe call 911 IMMEDIATELY. If it is mild, please call our office. . If you take any of these medications: Glipizide/Metformin, Avandament, Glucavance, please do not take 48 hours after completing test unless otherwise instructed.   Once we have confirmed authorization from your insurance company, we will call you to set up a date and time for your test. Based on how quickly your insurance processes prior authorizations requests, please allow up to 4 weeks to be contacted for scheduling your Cardiac CT appointment. Be advised that routine Cardiac CT appointments could be scheduled as many as 8 weeks after your provider has ordered it.  For non-scheduling related questions, please contact the cardiac imaging nurse navigator should you have any questions/concerns: Marchia Bond, Cardiac Imaging Nurse Navigator Gordy Clement, Cardiac Imaging Nurse Navigator [facility] Direct Office Dial: [phone]   For scheduling needs, including cancellations and rescheduling, please call [place], [phone].

## 2020-07-18 NOTE — Progress Notes (Signed)
We instructed her to double her home metoprolol the day of the scan to 100 mg.    I am fine with ivabradine if that has been more effective for the CT scan.   JV

## 2020-07-21 ENCOUNTER — Telehealth (HOSPITAL_COMMUNITY): Payer: Self-pay | Admitting: *Deleted

## 2020-07-21 NOTE — Telephone Encounter (Signed)
Attempted to call patient regarding upcoming cardiac CT appointment. °Left message on voicemail with name and callback number ° °Eilene Voigt RN Navigator Cardiac Imaging °[facility] °[phone] Office °[phone] Cell ° °

## 2020-07-21 NOTE — Telephone Encounter (Signed)
Reaching out to patient to offer assistance regarding upcoming cardiac imaging study; pt verbalizes understanding of appt date/time, parking situation and where to check in, pre-test NPO status and medications ordered, and verified current allergies; name and call back number provided for further questions should they arise  Gordy Clement RN Navigator Cardiac Imaging [facility] [phone] office [phone] cell  Pt to take an extra dose of her metoprolol 2 hours prior to Cardiac CT scan.

## 2020-07-22 ENCOUNTER — Other Ambulatory Visit: Payer: Self-pay

## 2020-07-22 ENCOUNTER — Encounter (HOSPITAL_COMMUNITY): Payer: Self-pay

## 2020-07-22 ENCOUNTER — Ambulatory Visit (HOSPITAL_COMMUNITY)
Admission: RE | Admit: 2020-07-22 | Discharge: 2020-07-22 | Disposition: A | Discharge status: Home / Self Care | Payer: 59 | Source: Ambulatory Visit | Attending: Interventional Cardiology | Admitting: Interventional Cardiology

## 2020-07-22 DIAGNOSIS — R072 Precordial pain: Secondary | ICD-10-CM | POA: Diagnosis not present

## 2020-07-22 DIAGNOSIS — R55 Syncope and collapse: Secondary | ICD-10-CM | POA: Diagnosis not present

## 2020-07-22 DIAGNOSIS — Z006 Encounter for examination for normal comparison and control in clinical research program: Secondary | ICD-10-CM

## 2020-07-22 MED ORDER — METOPROLOL TARTRATE 5 MG/5ML IV SOLN
10.0000 mg | INTRAVENOUS | Status: DC | PRN
  Administered 2020-07-22: 10 mg via INTRAVENOUS

## 2020-07-22 MED ORDER — NITROGLYCERIN 0.4 MG SL SUBL
0.8000 mg | SUBLINGUAL_TABLET | Freq: Once | SUBLINGUAL | Status: AC
  Administered 2020-07-22: 0.8 mg via SUBLINGUAL

## 2020-07-22 MED ORDER — METOPROLOL TARTRATE 5 MG/5ML IV SOLN
INTRAVENOUS | Status: AC
  Administered 2020-07-22: 10 mg via INTRAVENOUS
  Filled 2020-07-22: qty 10

## 2020-07-22 MED ORDER — DILTIAZEM HCL 25 MG/5ML IV SOLN: 10.0000 mg | INTRAVENOUS | Status: DC | PRN

## 2020-07-22 MED ORDER — METOPROLOL TARTRATE 5 MG/5ML IV SOLN
INTRAVENOUS | Status: AC
  Filled 2020-07-22: qty 10

## 2020-07-22 MED ORDER — IOHEXOL 350 MG/ML SOLN
95.0000 mL | Freq: Once | INTRAVENOUS | Status: AC | PRN
  Administered 2020-07-22: 95 mL via INTRAVENOUS

## 2020-07-22 MED ORDER — DILTIAZEM HCL 25 MG/5ML IV SOLN
INTRAVENOUS | Status: AC
  Administered 2020-07-22: 10 mg via INTRAVENOUS
  Filled 2020-07-22: qty 5

## 2020-07-22 MED ORDER — NITROGLYCERIN 0.4 MG SL SUBL
SUBLINGUAL_TABLET | SUBLINGUAL | Status: AC
  Filled 2020-07-22: qty 2

## 2020-07-22 NOTE — Research (Signed)
IDENTIFY Informed Consent                  Subject Name: Jaime Rhodes   Subject met inclusion and exclusion criteria.  The informed consent form, study requirements and expectations were reviewed with the subject and questions and concerns were addressed prior to the signing of the consent form.  The subject verbalized understanding of the trial requirements.  The subject agreed to participate in the IDENTIFY trial and signed the informed consent at 12:56PM on 07/22/20.  The informed consent was obtained prior to performance of any protocol-specific procedures for the subject.  A copy of the signed informed consent was given to the subject and a copy was placed in the subject's medical record.   Meade Maw , Naval architect

## 2020-08-12 MED FILL — Meloxicam Tab 15 MG: ORAL | 30 days supply | Qty: 30 | Fill #0 | Status: AC

## 2020-08-12 NOTE — Progress Notes (Addendum)
NEUROLOGY CONSULTATION NOTE  Jaime Rhodes MRN: [id number] DOB: [birth year]  Referring provider: Benjiman Core, PA Primary care provider: Marilynne Drivers, PA  Reason for consult:  Postconcussion syndrome  Assessment/Plan:   1.  Postconcussion syndrome 2.  Chronic posttraumatic migraines 3.  Vertigo - may be combination of BPPV and central/vestibular migraine 4.  Syncope - may have been orthostatic since it occurred just after standing up, but since she denied preceding warning, will evaluate for possible seizure.  1.  Start nortriptyline 10mg , titrating to 20mg  at bedtime. 2.  For severe migraines, rizatriptan and Zofran 3.  Refer to vestibular rehab 4.  Check EEG 5.  Limit use of pain relievers to no more than 2 days out of week to prevent risk of rebound or medication-overuse headache. 6.  Keep headache diary 7.  Follow up 6 months.   Subjective:  Jaime Rhodes is a 45 year old right-handed female with HTN who presents for postconcussion syndrome.  History supplemented by ED and cardiology notes.  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  She had an episode of syncope at work on 05/08/2020.  She was standing at her work station when she bent down to pick something up and when she stood up, she lost consciousness. She fell hitting her head and right shoulder on a dehumidifier.  She reports previous episode of syncope but did not have any warning before she passed out this time.  No convulsions, tongue biting, incontinence or postictal confusion..  She had not eaten since the previous afternoon.  CT head personally reviewed was negative for acute intracranial abnormality.  She was diagnosed in the ED with dehydration.  She saw outpatient cardiology and has an echocardiogram pending.    She was out of work for the first 3 weeks after the concussion.  She now is working on [employer] duty.  Since the fall, she has had daily headaches and dizziness.   Headaches are diffuse and bilateral retro-orbital pulsations associated with seeing floaters and nausea.  They are sometimes preceded by feeling lightheaded.  She has a constant dull headache but severe headaches occur once a week and last 1/2 day to 1 day.  Moderate headaches occur 3-4 times a week and last about an hour with Tylenol.  She has history of migraines in her teens-20s.  She gets vertigo/spinning sensation when she has a severe migraine.  However, she has brief vertigo when she turns her head to the left.    Since the injury, she has trouble multitasking.  She needs to finish one chore before she can move onto the next activity.  She continues to have right shoulder pain and has been evaluated by her orthopedist.  She sees a chiropractor 1 to 2 times a week, which has been helpful.     PAST MEDICAL HISTORY: Past Medical History:  Diagnosis Date   Acne rosacea    GERD (gastroesophageal reflux disease)    treated prn with nexium-will take dos   Herpes labialis    Hypertension    T2DM (type 2 diabetes mellitus) ([place])     PAST SURGICAL HISTORY: Past Surgical History:  Procedure Laterality Date   LAPAROSCOPIC ASSISTED VAGINAL HYSTERECTOMY  02/22/2012   Procedure: LAPAROSCOPIC ASSISTED VAGINAL HYSTERECTOMY;  Surgeon: Margarette Asal, MD;  Location: [facility];  Service: Gynecology;  Laterality: N/A;   WISDOM TOOTH EXTRACTION      MEDICATIONS: Current Outpatient Medications on File Prior to Visit  Medication Sig Dispense Refill   Albuterol Sulfate (PROAIR RESPICLICK) 440 (90 Base) MCG/ACT AEPB Inhale 2 puffs into the lungs every 6 (six) hours as needed (shortness of breath). 1 each 2   azithromycin (ZITHROMAX) 250 MG tablet Take 2 tabs now then 1 daily times 4 days 6 tablet 0   azithromycin (ZITHROMAX) 250 MG tablet TAKE 2 TABLETS BY MOUTH ON DAY 1 THEN TAKE 1 TABLET DAILY FOR THE NEXT 4 DAYS. (Patient taking differently: Take by mouth daily. for the next 4 days) 6 tablet 0    benzonatate (TESSALON PERLES) 100 MG capsule Take 1-2 capsules (100-200 mg total) by mouth every 8 (eight) hours as needed for cough. 30 capsule 0   cetirizine (ZYRTEC) 10 MG tablet Can take one tablet by mouth one to two times daily as directed. 60 tablet 5   doxycycline (VIBRAMYCIN) 100 MG capsule TAKE 1 CAPSULE BY MOUTH 2 TIMES A DAY FOR 10 DAYS 20 capsule 0   fluconazole (DIFLUCAN) 150 MG tablet TAKE 1 TABLET BY MOUTH ONCE. MAY TAKE 2ND TABLET IF SYMPTOMS APPEAR AGAIN AFTER 72 HOURS 2 tablet 0   ibuprofen (ADVIL,MOTRIN) 800 MG tablet Take 1 tablet (800 mg total) by mouth every 8 (eight) hours as needed (mild pain). 30 tablet 2   meloxicam (MOBIC) 15 MG tablet TAKE 1 TABLET BY MOUTH ONCE A DAY AS NEEDED 30 tablet 1   metoprolol succinate (TOPROL-XL) 50 MG 24 hr tablet TAKE 1 TABLET BY MOUTH ONCE DAILY 90 tablet 1   metoprolol succinate (TOPROL-XL) 50 MG 24 hr tablet TAKE 1 TABLET BY MOUTH ONCE DAILY. 90 tablet 1   mometasone (ELOCON) 0.1 % cream Apply to skin lesion twice daily until resolved. 90 g 5   predniSONE (DELTASONE) 5 MG tablet Take 1 tablet (5 mg total) by mouth as directed. Taper 6,5,4,3,2,1 21 tablet 0   rosuvastatin (CRESTOR) 10 MG tablet TAKE 1 TABLET BY MOUTH AT BEDTIME 90 tablet 1   valACYclovir (VALTREX) 500 MG tablet Take 500 mg by mouth daily as needed. For fever blister     valACYclovir (VALTREX) 500 MG tablet TAKE 1 TABLET BY MOUTH EVERY 12 HOURS AS NEEDED FOR FEVER BLISTER 90 tablet 0   valsartan-hydrochlorothiazide (DIOVAN-HCT) 320-25 MG per tablet Take 1 tablet by mouth daily.     valsartan-hydrochlorothiazide (DIOVAN-HCT) 320-25 MG tablet TAKE 1 TABLET BY MOUTH ONCE A DAY 90 tablet 1   valsartan-hydrochlorothiazide (DIOVAN-HCT) 320-25 MG tablet TAKE 1 TABLET BY MOUTH ONCE DAILY. 90 tablet 1   No current facility-administered medications on file prior to visit.    ALLERGIES: Allergies  Allergen Reactions   Penicillins Rash    FAMILY HISTORY: Family History   Problem Relation Age of Onset   Heart attack Sister    Heart disease Sister     Objective:  Blood pressure 122/88, pulse 87, height 5\' 7"  (1.702 m), weight 225 lb 9.6 oz (102.3 kg), last menstrual period 01/23/2012, SpO2 97 %. General: No acute distress.  Patient appears well-groomed.   Head:  Normocephalic/atraumatic Eyes:  fundi examined but not visualized Neck: supple, no paraspinal tenderness, full range of motion Back: No paraspinal tenderness Heart: regular rate and rhythm Lungs: Clear to auscultation bilaterally. Vascular: No carotid bruits. Neurological Exam: Mental status: alert and oriented to person, place, and time, recent and remote memory intact, fund of knowledge intact, attention and concentration intact, speech fluent and not dysarthric, language intact. Cranial nerves: CN I: not tested CN II: pupils equal, round and reactive  to light, visual fields intact CN III, IV, VI:  full range of motion, no nystagmus, no ptosis CN V: facial sensation intact. CN VII: upper and lower face symmetric CN VIII: hearing intact CN IX, X: gag intact, uvula midline CN XI: sternocleidomastoid and trapezius muscles intact CN XII: tongue midline Bulk & Tone: normal, no fasciculations. Motor:  muscle strength 5/5 throughout Sensation:  Pinprick, temperature and vibratory sensation intact. Deep Tendon Reflexes:  2+ throughout,  toes downgoing.   Finger to nose testing:  Without dysmetria.   Heel to shin:  Without dysmetria.   Gait:  Normal station and stride.  Romberg negative.    Thank you for allowing me to take part in the care of this patient.  Metta Clines, DO  CC:  Benjiman Core, PA  Marilynne Drivers, PA

## 2020-08-13 ENCOUNTER — Other Ambulatory Visit (HOSPITAL_COMMUNITY): Payer: Self-pay

## 2020-08-14 ENCOUNTER — Ambulatory Visit (INDEPENDENT_AMBULATORY_CARE_PROVIDER_SITE_OTHER): Payer: 59 | Admitting: Neurology

## 2020-08-14 ENCOUNTER — Telehealth: Payer: Self-pay

## 2020-08-14 ENCOUNTER — Other Ambulatory Visit (HOSPITAL_COMMUNITY): Payer: Self-pay

## 2020-08-14 ENCOUNTER — Encounter: Payer: Self-pay | Admitting: Neurology

## 2020-08-14 ENCOUNTER — Other Ambulatory Visit: Payer: Self-pay

## 2020-08-14 VITALS — BP 122/88 | HR 87 | Ht 67.0 in | Wt 225.6 lb

## 2020-08-14 DIAGNOSIS — R55 Syncope and collapse: Secondary | ICD-10-CM | POA: Diagnosis not present

## 2020-08-14 DIAGNOSIS — G43709 Chronic migraine without aura, not intractable, without status migrainosus: Secondary | ICD-10-CM

## 2020-08-14 DIAGNOSIS — R42 Dizziness and giddiness: Secondary | ICD-10-CM

## 2020-08-14 DIAGNOSIS — G44329 Chronic post-traumatic headache, not intractable: Secondary | ICD-10-CM | POA: Diagnosis not present

## 2020-08-14 MED ORDER — ONDANSETRON 4 MG PO TBDP
4.0000 mg | ORAL_TABLET | Freq: Three times a day (TID) | ORAL | 5 refills | Status: DC | PRN
  Filled 2020-08-14: qty 20, 7d supply, fill #0

## 2020-08-14 MED ORDER — NORTRIPTYLINE HCL 10 MG PO CAPS
ORAL_CAPSULE | ORAL | 0 refills | Status: DC
  Filled 2020-08-14: qty 53, 30d supply, fill #0
  Filled 2020-10-14: qty 53, 3d supply, fill #1

## 2020-08-14 MED ORDER — RIZATRIPTAN BENZOATE 10 MG PO TBDP
10.0000 mg | ORAL_TABLET | ORAL | 5 refills | Status: DC | PRN
  Filled 2020-08-14: qty 10, 30d supply, fill #0

## 2020-08-14 NOTE — Patient Instructions (Addendum)
  1. Get an eye exam 2. Will refer you to physical therapy for vestibular rehab 3. Start nortriptyline 10mg  - take 1 capsule at bedtime for a week, then 2 capsules at bedim.  Contact us in 4-5 weeks with update and we can increase dose if needed. 4. Take rizatriptan 10mg  at earliest onset of headache.  May repeat dose once in 2 hours if needed.  Maximum 2 tablets in 24 hours.   5. Take ondansetron for nausea 6. Limit use of pain relievers to no more than 2 days out of the week.  These medications include acetaminophen, NSAIDs (ibuprofen/Advil/Motrin, naproxen/Aleve, triptans (Imitrex/sumatriptan), Excedrin, and narcotics.  This will help reduce risk of rebound headaches. 7. Be aware of common food triggers:  - Caffeine:  coffee, black tea, cola, Mt. Dew  - Chocolate  - Dairy:  aged cheeses (brie, blue, cheddar, gouda, [place], provolone, Boyce, Swiss, etc), chocolate milk, buttermilk, sour cream, limit eggs and yogurt  - Nuts, peanut butter  - Alcohol  - Cereals/grains:  FRESH breads (fresh bagels, sourdough, doughnuts), yeast productions  - Processed/canned/aged/cured meats (pre-packaged deli meats, hotdogs)  - MSG/glutamate:  soy sauce, flavor enhancer, pickled/preserved/marinated foods  - Sweeteners:  aspartame (Equal, Nutrasweet).  Sugar and Splenda are okay  - Vegetables:  legumes (lima beans, lentils, snow peas, fava beans, pinto peans, peas, garbanzo beans), sauerkraut, onions, olives, pickles  - Fruit:  avocados, bananas, citrus fruit (orange, lemon, grapefruit), mango  - Other:  Frozen meals, macaroni and cheese 8. Routine exercise 9. Stay adequately hydrated (aim for 64 oz water daily) 10. Keep headache diary 11. Maintain proper stress management 12. Maintain proper sleep hygiene 13. Do not skip meals 14. Consider supplements:  magnesium citrate 400mg  daily, riboflavin 400mg  daily, coenzyme Q10 100mg  three times daily. 15. EEG

## 2020-08-20 ENCOUNTER — Ambulatory Visit (HOSPITAL_COMMUNITY): Payer: 59 | Attending: Cardiovascular Disease

## 2020-08-20 ENCOUNTER — Other Ambulatory Visit: Payer: Self-pay

## 2020-08-20 DIAGNOSIS — R55 Syncope and collapse: Secondary | ICD-10-CM | POA: Diagnosis not present

## 2020-08-20 DIAGNOSIS — I10 Essential (primary) hypertension: Secondary | ICD-10-CM | POA: Diagnosis not present

## 2020-08-20 DIAGNOSIS — R072 Precordial pain: Secondary | ICD-10-CM | POA: Diagnosis not present

## 2020-08-20 LAB — ECHOCARDIOGRAM COMPLETE
Area-P 1/2: 3.19 cm2
S' Lateral: 2.6 cm

## 2020-08-21 ENCOUNTER — Ambulatory Visit (INDEPENDENT_AMBULATORY_CARE_PROVIDER_SITE_OTHER): Payer: 59 | Admitting: Neurology

## 2020-08-21 DIAGNOSIS — R42 Dizziness and giddiness: Secondary | ICD-10-CM

## 2020-08-21 DIAGNOSIS — G44329 Chronic post-traumatic headache, not intractable: Secondary | ICD-10-CM | POA: Diagnosis not present

## 2020-08-22 ENCOUNTER — Telehealth: Payer: Self-pay | Admitting: Neurology

## 2020-08-22 NOTE — Telephone Encounter (Signed)
Please let patient know that EEG is normal

## 2020-08-22 NOTE — Procedures (Signed)
ELECTROENCEPHALOGRAM REPORT  Date of Study: 08/21/2020  Patient's Name: Jaime Rhodes MRN: [id number] Date of Birth: 04-11-1975  Clinical History: 45 year old female with chronic postconcussion syndrome, chronic migraines and vertigo who presents for syncope  Medications: PROAIR RESPICLICK 948 (90 Base) MCG/ACT AEPB ZITHROMAX 250 MG tablet TESSALON PERLES 100 MG capsule ZYRTEC 10 MG tablet VIBRAMYCIN 100 MG capsule DIFLUCAN 150 MG tablet ADVIL,MOTRIN 800 MG tablet MOBIC 15 MG tablet TOPROL-XL 50 MG 24 hr tablet ELOCON 0.1 % cream DELTASONE 5 MG tablet CRESTOR 10 MG tablet VALTREX 500 MG tablet DIOVAN-HCT 320-25 MG per tablet  Technical Summary: A multichannel digital EEG recording measured by the international 10-20 system with electrodes applied with paste and impedances below 5000 ohms performed in our laboratory with EKG monitoring in an awake and asleep patient.  Photic stimulation was performed.  The digital EEG was referentially recorded, reformatted, and digitally filtered in a variety of bipolar and referential montages for optimal display.    Description: The patient is awake and asleep during the recording.  During maximal wakefulness, there is a symmetric, medium voltage 11-12 Hz posterior dominant rhythm that attenuates with eye opening.  The record is symmetric.  During drowsiness and sleep, there is an increase in theta slowing of the background.  Vertex waves and symmetric sleep spindles were seen.  Photic stimulation did not elicit any abnormalities.  There were no epileptiform discharges or electrographic seizures seen.    EKG lead was unremarkable.  Impression: This awake and asleep EEG is normal.    Clinical Correlation: A normal EEG does not exclude a clinical diagnosis of epilepsy.  If further clinical questions remain, prolonged EEG may be helpful.  Clinical correlation is advised.   Metta Clines, DO

## 2020-08-25 NOTE — Telephone Encounter (Signed)
Patient advised of her EEG results.

## 2020-08-25 NOTE — Telephone Encounter (Signed)
Opened in error

## 2020-09-19 ENCOUNTER — Other Ambulatory Visit (HOSPITAL_COMMUNITY): Payer: Self-pay

## 2020-09-23 ENCOUNTER — Other Ambulatory Visit (HOSPITAL_COMMUNITY): Payer: Self-pay

## 2020-09-26 ENCOUNTER — Encounter: Payer: Self-pay | Admitting: Neurology

## 2020-10-14 ENCOUNTER — Other Ambulatory Visit: Payer: Self-pay | Admitting: Neurology

## 2020-10-14 ENCOUNTER — Other Ambulatory Visit (HOSPITAL_COMMUNITY): Payer: Self-pay

## 2020-10-14 DIAGNOSIS — I10 Essential (primary) hypertension: Secondary | ICD-10-CM | POA: Diagnosis not present

## 2020-10-14 DIAGNOSIS — M25512 Pain in left shoulder: Secondary | ICD-10-CM | POA: Diagnosis not present

## 2020-10-14 DIAGNOSIS — R040 Epistaxis: Secondary | ICD-10-CM | POA: Diagnosis not present

## 2020-10-14 DIAGNOSIS — G43109 Migraine with aura, not intractable, without status migrainosus: Secondary | ICD-10-CM | POA: Diagnosis not present

## 2020-10-14 DIAGNOSIS — K219 Gastro-esophageal reflux disease without esophagitis: Secondary | ICD-10-CM | POA: Diagnosis not present

## 2020-10-14 DIAGNOSIS — E1169 Type 2 diabetes mellitus with other specified complication: Secondary | ICD-10-CM | POA: Diagnosis not present

## 2020-10-14 DIAGNOSIS — J320 Chronic maxillary sinusitis: Secondary | ICD-10-CM | POA: Diagnosis not present

## 2020-10-14 DIAGNOSIS — E785 Hyperlipidemia, unspecified: Secondary | ICD-10-CM | POA: Diagnosis not present

## 2020-10-14 DIAGNOSIS — B001 Herpesviral vesicular dermatitis: Secondary | ICD-10-CM | POA: Diagnosis not present

## 2020-10-14 MED ORDER — ROSUVASTATIN CALCIUM 10 MG PO TABS
10.0000 mg | ORAL_TABLET | Freq: Every day | ORAL | 1 refills | Status: DC
  Filled 2020-10-14: qty 90, 90d supply, fill #0
  Filled 2021-04-25: qty 90, 90d supply, fill #1

## 2020-10-14 MED ORDER — VALSARTAN-HYDROCHLOROTHIAZIDE 320-25 MG PO TABS
1.0000 | ORAL_TABLET | Freq: Every day | ORAL | 1 refills | Status: DC
  Filled 2020-10-14: qty 90, 90d supply, fill #0

## 2020-10-14 MED ORDER — NORTRIPTYLINE HCL 10 MG PO CAPS
ORAL_CAPSULE | ORAL | 0 refills | Status: DC
  Filled 2020-10-14: qty 60, 30d supply, fill #0

## 2020-10-14 MED ORDER — METOPROLOL SUCCINATE ER 50 MG PO TB24
50.0000 mg | ORAL_TABLET | Freq: Every day | ORAL | 1 refills | Status: DC
  Filled 2020-10-14: qty 90, 90d supply, fill #0

## 2020-10-16 ENCOUNTER — Other Ambulatory Visit (HOSPITAL_COMMUNITY): Payer: Self-pay

## 2020-10-16 ENCOUNTER — Telehealth: Payer: Self-pay | Admitting: *Deleted

## 2020-10-16 DIAGNOSIS — Z006 Encounter for examination for normal comparison and control in clinical research program: Secondary | ICD-10-CM

## 2020-10-16 MED ORDER — METFORMIN HCL 500 MG PO TABS
500.0000 mg | ORAL_TABLET | Freq: Every day | ORAL | 1 refills | Status: DC
  Filled 2020-10-16: qty 90, 90d supply, fill #0

## 2020-10-16 NOTE — Telephone Encounter (Signed)
Patient e-mailed me back and is still having same cardiac symptoms prior to Cardiac CT which is chest pressure . She is following up with cardiologist. Patient had echo on 08/20/2020.

## 2020-10-16 NOTE — Telephone Encounter (Signed)
I called patient for 90-day Identify study phone call. I left message for patient to call me or e-mail me.

## 2020-11-25 ENCOUNTER — Other Ambulatory Visit: Payer: Self-pay

## 2020-11-25 ENCOUNTER — Ambulatory Visit (INDEPENDENT_AMBULATORY_CARE_PROVIDER_SITE_OTHER): Payer: 59 | Admitting: Otolaryngology

## 2020-11-25 DIAGNOSIS — Z87898 Personal history of other specified conditions: Secondary | ICD-10-CM | POA: Diagnosis not present

## 2020-11-25 DIAGNOSIS — J320 Chronic maxillary sinusitis: Secondary | ICD-10-CM

## 2020-11-25 NOTE — Progress Notes (Signed)
HPI: Jaime Rhodes is a 45 y.o. female who presents is referred by her PCP for evaluation of maxillary sinus opacification noted on MRI scan performed in February of this year.  Apparently in February patient passed out while at work.  She was referred to neurology who diagnosed her with migraine headaches.  She had an MRI scan and a CT scan of her head that showed opacification of the left maxillary sinus with clear remaining sinuses.  Of note on review of the MRI and CT scan she has a small left maxillary sinus compared to the right but the left maxillary sinus is opacified which appears to be chronic.  She denies any discomfort or pain in her left cheek and no discomfort or pain in her upper molars on the left side.  She has had no yellow-green discharge from her nose.  She does have mild nasal congestion. She has had history of sinus infections in the past where she takes antibiotics and decongestants.  But she really does not feel sick at this point.  She does have mild nasal congestion.  She has been prescribed nasal steroid sprays in the past but these apparently caused her to have headaches and she does not take any nasal sprays.. She is also has some episodes of nosebleeds but has not had a nosebleed in over 4 weeks.  Past Medical History:  Diagnosis Date   Acne rosacea    GERD (gastroesophageal reflux disease)    treated prn with nexium-will take dos   Herpes labialis    Hypertension    T2DM (type 2 diabetes mellitus) ([place])    Past Surgical History:  Procedure Laterality Date   LAPAROSCOPIC ASSISTED VAGINAL HYSTERECTOMY  02/22/2012   Procedure: LAPAROSCOPIC ASSISTED VAGINAL HYSTERECTOMY;  Surgeon: Margarette Asal, MD;  Location: [facility];  Service: Gynecology;  Laterality: N/A;   WISDOM TOOTH EXTRACTION     Social History   Socioeconomic History   Marital status: Married    Spouse name: Not on file   Number of children: Not on file   Years of education: Not on file   Highest  education level: Not on file  Occupational History   Not on file  Tobacco Use   Smoking status: Never   Smokeless tobacco: Never  Vaping Use   Vaping Use: Never used  Substance and Sexual Activity   Alcohol use: No   Drug use: No   Sexual activity: Yes  Other Topics Concern   Not on file  Social History Narrative   Not on file   Social Determinants of Health   Financial Resource Strain: Not on file  Food Insecurity: Not on file  Transportation Needs: Not on file  Physical Activity: Not on file  Stress: Not on file  Social Connections: Not on file   Family History  Problem Relation Age of Onset   Heart attack Sister    Heart disease Sister    Stroke Sister    Stroke Maternal Grandmother    Allergies  Allergen Reactions   Penicillins Rash   Prior to Admission medications   Medication Sig Start Date End Date Taking? Authorizing Provider  Albuterol Sulfate (PROAIR RESPICLICK) 123XX123 (90 Base) MCG/ACT AEPB Inhale 2 puffs into the lungs every 6 (six) hours as needed (shortness of breath). Patient not taking: Reported on 08/14/2020 03/17/18   Benjamine Mola, FNP  cetirizine (ZYRTEC) 10 MG tablet Can take one tablet by mouth one to two times daily as directed. 06/01/17  Kozlow, Donnamarie Poag, MD  ibuprofen (ADVIL,MOTRIN) 800 MG tablet Take 1 tablet (800 mg total) by mouth every 8 (eight) hours as needed (mild pain). Patient not taking: Reported on 08/14/2020 02/23/12   Molli Posey, MD  meloxicam (MOBIC) 15 MG tablet TAKE 1 TABLET BY MOUTH ONCE A DAY AS NEEDED 04/15/20 04/15/21  Lois Huxley, PA  metFORMIN (GLUCOPHAGE) 500 MG tablet Take 1 tablet (500 mg total) by mouth daily. 10/16/20     metoprolol succinate (TOPROL-XL) 50 MG 24 hr tablet TAKE 1 TABLET BY MOUTH ONCE DAILY 04/15/20 04/15/21  Lois Huxley, PA  metoprolol succinate (TOPROL-XL) 50 MG 24 hr tablet TAKE 1 TABLET BY MOUTH ONCE DAILY. 01/11/20 01/10/21  Lois Huxley, PA  metoprolol succinate (TOPROL-XL) 50 MG 24 hr tablet Take 1  tablet (50 mg total) by mouth daily. 10/14/20     mometasone (ELOCON) 0.1 % cream Apply to skin lesion twice daily until resolved. Patient not taking: Reported on 08/14/2020 06/01/17   Jiles Prows, MD  nortriptyline (PAMELOR) 10 MG capsule Take 1 capsule (10 mg total) by mouth at bedtime for 7 days, THEN 2 capsules (20 mg total) at bedtime 10/14/20 11/19/20  Pieter Partridge, DO  ondansetron (ZOFRAN ODT) 4 MG disintegrating tablet Take 1 tablet (4 mg total) by mouth every 8 (eight) hours as needed for nausea or vomiting. 08/14/20   Pieter Partridge, DO  rizatriptan (MAXALT-MLT) 10 MG disintegrating tablet Take 1 tablet (10 mg total) by mouth as needed for migraine.  May repeat in 2 hours if needed.  Maximum 2 tablets in 24 hours. 08/14/20   Pieter Partridge, DO  rosuvastatin (CRESTOR) 10 MG tablet TAKE 1 TABLET BY MOUTH AT BEDTIME 04/15/20 04/15/21  Lois Huxley, PA  rosuvastatin (CRESTOR) 10 MG tablet Take 1 tablet (10 mg total) by mouth at bedtime. 10/14/20     valACYclovir (VALTREX) 500 MG tablet Take 500 mg by mouth daily as needed. For fever blister    [provider]  valACYclovir (VALTREX) 500 MG tablet TAKE 1 TABLET BY MOUTH EVERY 12 HOURS AS NEEDED FOR FEVER BLISTER 04/15/20 04/15/21  Lois Huxley, PA  valsartan-hydrochlorothiazide (DIOVAN-HCT) 320-25 MG per tablet Take 1 tablet by mouth daily.    [provider]  valsartan-hydrochlorothiazide (DIOVAN-HCT) 320-25 MG tablet TAKE 1 TABLET BY MOUTH ONCE A DAY 04/15/20 04/15/21  Lois Huxley, PA  valsartan-hydrochlorothiazide (DIOVAN-HCT) 320-25 MG tablet TAKE 1 TABLET BY MOUTH ONCE DAILY. 01/11/20 01/10/21  Lois Huxley, PA  valsartan-hydrochlorothiazide (DIOVAN-HCT) 320-25 MG tablet Take 1 tablet by mouth daily. 10/14/20        Positive ROS: Otherwise negative  All other systems have been reviewed and were otherwise negative with the exception of those mentioned in the HPI and as above.  Physical Exam: Constitutional: Alert, well-appearing, no  acute distress Ears: External ears without lesions or tenderness. Ear canals are clear bilaterally with intact, clear TMs.  Nasal: External nose without lesions. Septum slightly deviated to the left.  On nasal endoscopy there is no obvious mucopurulent discharge noted on the left side.  She does have some fullness of the left middle meatus compared to the right.  No abnormal polyps or lesions noted.Reubin Milan identify a source or site of bleeding that she had several weeks ago.  Applying pressure over the maxillary sinuses was nontender on either side. Oral: Lips and gums without lesions. Tongue and palate mucosa without lesions. Posterior oropharynx clear. Neck: No palpable  adenopathy or masses Respiratory: Breathing comfortably  Skin: No facial/neck lesions or rash noted.  Procedures  Assessment: Left maxillary sinus opacification appears chronic on review of the MRI scan and CT scan.  This is otherwise asymptomatic and on clinical exam no evidence of acute infection.  Doubt this is causing her headaches.  Plan: Reviewed the CT scan and the MRI scan with the patient in the office today.  I discussed with her that this appears to be chronic opacification of left maxillary sinus as the left maxillary sinus is smaller than the right and there are no significant inflammatory changes on clinical exam and no purulent discharge noted. She will follow-up if she has any further bleeding or any pain or pressure associated with the left maxillary sinus for recheck as needed.   Radene Journey, MD   CC:

## 2020-12-11 ENCOUNTER — Other Ambulatory Visit (HOSPITAL_COMMUNITY): Payer: Self-pay

## 2020-12-11 MED ORDER — FLUCONAZOLE 150 MG PO TABS
150.0000 mg | ORAL_TABLET | ORAL | 0 refills | Status: DC
  Filled 2020-12-11: qty 2, 3d supply, fill #0

## 2021-01-13 ENCOUNTER — Other Ambulatory Visit (HOSPITAL_COMMUNITY): Payer: Self-pay

## 2021-01-13 MED ORDER — FLUCONAZOLE 150 MG PO TABS
150.0000 mg | ORAL_TABLET | ORAL | 0 refills | Status: DC
  Filled 2021-01-13: qty 2, 3d supply, fill #0

## 2021-01-13 MED ORDER — MELOXICAM 15 MG PO TABS
15.0000 mg | ORAL_TABLET | Freq: Every day | ORAL | 0 refills | Status: DC | PRN
  Filled 2021-01-13: qty 30, 30d supply, fill #0

## 2021-02-17 NOTE — Progress Notes (Signed)
NEUROLOGY FOLLOW UP OFFICE NOTE  [name] [id number]  Assessment/Plan:   Vestibular migraine Postconcussion syndrome, resolved   Migraine prevention:  restart nortriptyline 25mg  at bedtime.  Increase to 50mg  at bedtime in 6 weeks if needed Migraine rescue:  rizatriptan 10mg  at earliest onset of migraine Limit use of pain relievers to no more than 2 days out of week to prevent risk of rebound or medication-overuse headache. Keep headache diary Follow up 7-8 months.   Subjective:  Jaime Rhodes is a 45 year old right-handed female with HTN who follows up for postconcussion syndrome.   UPDATE: Current medications:  rizatriptan 10mg   In June, she was started on nortriptyline and referred for vestibular rehab.  She stopped nortriptyline for 2-3 months and then stopped due to GI upset.  She didn't go to vestibular rehab because it improved.  However, she gets the dizziness only with the migraine.  Lasts from 2 hours to all day.  Not treating with these migraines.  Migraines occur once a week (usually mid-week and in the mornings).  Pain are manageable.  However the vertigo and blurred vision is prominent.    Routine awake and asleep EEG on 08/21/2020 was normal.   HISTORY: Patient works as a Gaffer in the histology lab at [employer]  She had an episode of syncope at work on 05/08/2020.  She was standing at her work station when she bent down to pick something up and when she stood up, she lost consciousness. She fell hitting her head and right shoulder on a dehumidifier.  She reports previous episode of syncope but did not have any warning before she passed out this time.  No convulsions, tongue biting, incontinence or postictal confusion..  She had not eaten since the previous afternoon.  CT head personally reviewed was negative for acute intracranial abnormality.  She was diagnosed in the ED with dehydration.  She saw outpatient cardiology and has an  echocardiogram pending.     She was out of work for the first 3 weeks after the concussion.  She now is working on [employer] duty.   Since the fall, she has had daily headaches and dizziness.  Headaches are diffuse and bilateral retro-orbital pulsations associated with seeing floaters and nausea.  They are sometimes preceded by feeling lightheaded.  She has a constant dull headache but severe headaches occur once a week and last 1/2 day to 1 day.  Moderate headaches occur 3-4 times a week and last about an hour with Tylenol.  She has history of migraines in her teens-20s.  She gets vertigo/spinning sensation when she has a severe migraine.  However, she has brief vertigo when she turns her head to the left.     Since the injury, she has trouble multitasking.  She needs to finish one chore before she can move onto the next activity.  She continues to have right shoulder pain and has been evaluated by her orthopedist.  She sees a chiropractor 1 to 2 times a week, which has been helpful.  Past medications: nortriptyline, Zofran  PAST MEDICAL HISTORY: Past Medical History:  Diagnosis Date   Acne rosacea    GERD (gastroesophageal reflux disease)    treated prn with nexium-will take dos   Herpes labialis    Hypertension    T2DM (type 2 diabetes mellitus) ([place])     MEDICATIONS: Current Outpatient Medications on File Prior to Visit  Medication Sig Dispense Refill   Albuterol Sulfate (PROAIR RESPICLICK)  108 (90 Base) MCG/ACT AEPB Inhale 2 puffs into the lungs every 6 (six) hours as needed (shortness of breath). (Patient not taking: Reported on 08/14/2020) 1 each 2   cetirizine (ZYRTEC) 10 MG tablet Can take one tablet by mouth one to two times daily as directed. 60 tablet 5   fluconazole (DIFLUCAN) 150 MG tablet Take 1 tablet (150 mg total) by mouth as directed once, may take the second tablet if symptoms appear again after 72 hours 2 tablet 0   ibuprofen (ADVIL,MOTRIN) 800 MG tablet Take 1 tablet (800  mg total) by mouth every 8 (eight) hours as needed (mild pain). (Patient not taking: Reported on 08/14/2020) 30 tablet 2   meloxicam (MOBIC) 15 MG tablet TAKE 1 TABLET BY MOUTH ONCE A DAY AS NEEDED 30 tablet 1   meloxicam (MOBIC) 15 MG tablet Take 1 tablet (15 mg total) by mouth daily as needed. 30 tablet 0   metFORMIN (GLUCOPHAGE) 500 MG tablet Take 1 tablet (500 mg total) by mouth daily. 90 tablet 1   metoprolol succinate (TOPROL-XL) 50 MG 24 hr tablet TAKE 1 TABLET BY MOUTH ONCE DAILY 90 tablet 1   metoprolol succinate (TOPROL-XL) 50 MG 24 hr tablet TAKE 1 TABLET BY MOUTH ONCE DAILY. 90 tablet 1   metoprolol succinate (TOPROL-XL) 50 MG 24 hr tablet Take 1 tablet (50 mg total) by mouth daily. 90 tablet 1   mometasone (ELOCON) 0.1 % cream Apply to skin lesion twice daily until resolved. (Patient not taking: Reported on 08/14/2020) 90 g 5   nortriptyline (PAMELOR) 10 MG capsule Take 1 capsule (10 mg total) by mouth at bedtime for 7 days, THEN 2 capsules (20 mg total) at bedtime 60 capsule 0   ondansetron (ZOFRAN ODT) 4 MG disintegrating tablet Take 1 tablet (4 mg total) by mouth every 8 (eight) hours as needed for nausea or vomiting. 20 tablet 5   rizatriptan (MAXALT-MLT) 10 MG disintegrating tablet Take 1 tablet (10 mg total) by mouth as needed for migraine.  May repeat in 2 hours if needed.  Maximum 2 tablets in 24 hours. 10 tablet 5   rosuvastatin (CRESTOR) 10 MG tablet TAKE 1 TABLET BY MOUTH AT BEDTIME 90 tablet 1   rosuvastatin (CRESTOR) 10 MG tablet Take 1 tablet (10 mg total) by mouth at bedtime. 90 tablet 1   valACYclovir (VALTREX) 500 MG tablet Take 500 mg by mouth daily as needed. For fever blister     valACYclovir (VALTREX) 500 MG tablet TAKE 1 TABLET BY MOUTH EVERY 12 HOURS AS NEEDED FOR FEVER BLISTER 90 tablet 0   valsartan-hydrochlorothiazide (DIOVAN-HCT) 320-25 MG per tablet Take 1 tablet by mouth daily.     valsartan-hydrochlorothiazide (DIOVAN-HCT) 320-25 MG tablet TAKE 1 TABLET BY MOUTH  ONCE A DAY 90 tablet 1   valsartan-hydrochlorothiazide (DIOVAN-HCT) 320-25 MG tablet TAKE 1 TABLET BY MOUTH ONCE DAILY. 90 tablet 1   valsartan-hydrochlorothiazide (DIOVAN-HCT) 320-25 MG tablet Take 1 tablet by mouth daily. 90 tablet 1   No current facility-administered medications on file prior to visit.    ALLERGIES: Allergies  Allergen Reactions   Penicillins Rash    FAMILY HISTORY: Family History  Problem Relation Age of Onset   Heart attack Sister    Heart disease Sister    Stroke Sister    Stroke Maternal Grandmother       Objective:  Blood pressure 126/84, pulse (!) 109, height 5\' 7"  (1.702 m), weight 205 lb 9.6 oz (93.3 kg), last menstrual period 01/23/2012, SpO2  97 %. General: No acute distress.  Patient appears well-groomed.     Metta Clines, DO  CC: Marilynne Drivers, PA-C

## 2021-02-18 ENCOUNTER — Other Ambulatory Visit (HOSPITAL_COMMUNITY): Payer: Self-pay

## 2021-02-18 ENCOUNTER — Encounter: Payer: Self-pay | Admitting: Neurology

## 2021-02-18 ENCOUNTER — Ambulatory Visit (INDEPENDENT_AMBULATORY_CARE_PROVIDER_SITE_OTHER): Payer: 59 | Admitting: Neurology

## 2021-02-18 ENCOUNTER — Other Ambulatory Visit: Payer: Self-pay

## 2021-02-18 VITALS — BP 126/84 | HR 109 | Ht 67.0 in | Wt 205.6 lb

## 2021-02-18 DIAGNOSIS — F0781 Postconcussional syndrome: Secondary | ICD-10-CM | POA: Diagnosis not present

## 2021-02-18 DIAGNOSIS — G43809 Other migraine, not intractable, without status migrainosus: Secondary | ICD-10-CM

## 2021-02-18 MED ORDER — RIZATRIPTAN BENZOATE 10 MG PO TBDP
10.0000 mg | ORAL_TABLET | ORAL | 5 refills | Status: DC | PRN
  Filled 2021-02-18: qty 10, 30d supply, fill #0
  Filled 2021-03-31 – 2021-04-09 (×2): qty 10, 30d supply, fill #1
  Filled 2021-08-25: qty 10, 30d supply, fill #2

## 2021-02-18 MED ORDER — NORTRIPTYLINE HCL 25 MG PO CAPS
25.0000 mg | ORAL_CAPSULE | Freq: Every day | ORAL | 5 refills | Status: DC
  Filled 2021-02-18: qty 30, 30d supply, fill #0
  Filled 2021-04-25: qty 30, 30d supply, fill #1
  Filled 2021-06-18: qty 30, 30d supply, fill #2
  Filled 2021-08-24: qty 30, 30d supply, fill #3

## 2021-02-18 NOTE — Patient Instructions (Addendum)
  Start nortriptyline 25mg  at bedtime.  Contact us in 6 weeks with update and we can increase dose if needed. Take rizatriptan 10mg  at EARLIEST onset of headache.  May repeat dose once in 2 hours if needed.  Maximum 2 tablets in 24 hours. Limit use of pain relievers to no more than 2 days out of the week.  These medications include acetaminophen, NSAIDs (ibuprofen/Advil/Motrin, naproxen/Aleve, triptans (Imitrex/sumatriptan), Excedrin, and narcotics.  This will help reduce risk of rebound headaches. Be aware of common food triggers:  - Caffeine:  coffee, black tea, cola, Mt. Dew  - Chocolate  - Dairy:  aged cheeses (brie, blue, cheddar, gouda, [place], provolone, [place], Swiss, etc), chocolate milk, buttermilk, sour cream, limit eggs and yogurt  - Nuts, peanut butter  - Alcohol  - Cereals/grains:  FRESH breads (fresh bagels, sourdough, doughnuts), yeast productions  - Processed/canned/aged/cured meats (pre-packaged deli meats, hotdogs)  - MSG/glutamate:  soy sauce, flavor enhancer, pickled/preserved/marinated foods  - Sweeteners:  aspartame (Equal, Nutrasweet).  Sugar and Splenda are okay  - Vegetables:  legumes (lima beans, lentils, snow peas, fava beans, pinto peans, peas, garbanzo beans), sauerkraut, onions, olives, pickles  - Fruit:  avocados, bananas, citrus fruit (orange, lemon, grapefruit), mango  - Other:  Frozen meals, macaroni and cheese Routine exercise Stay adequately hydrated (aim for 64 oz water daily) Keep headache diary Maintain proper stress management Maintain proper sleep hygiene Do not skip meals Consider supplements:  magnesium citrate 400mg  daily, riboflavin 400mg  daily, coenzyme Q10 100mg  three times daily. Follow up 7-8 months.

## 2021-02-23 DIAGNOSIS — N760 Acute vaginitis: Secondary | ICD-10-CM | POA: Diagnosis not present

## 2021-02-24 ENCOUNTER — Other Ambulatory Visit (HOSPITAL_COMMUNITY): Payer: Self-pay

## 2021-02-24 MED ORDER — FREESTYLE LITE TEST VI STRP
ORAL_STRIP | 3 refills | Status: DC
  Filled 2021-02-24: qty 50, 50d supply, fill #0
  Filled 2021-04-09: qty 50, 50d supply, fill #1

## 2021-02-24 MED ORDER — NYSTATIN-TRIAMCINOLONE 100000-0.1 UNIT/GM-% EX CREA
TOPICAL_CREAM | CUTANEOUS | 1 refills | Status: DC
  Filled 2021-02-24: qty 30, 15d supply, fill #0

## 2021-02-24 MED ORDER — FREESTYLE LANCETS MISC
3 refills | Status: DC
  Filled 2021-02-24: qty 100, 90d supply, fill #0

## 2021-02-24 MED ORDER — TERCONAZOLE 0.4 % VA CREA
1.0000 | TOPICAL_CREAM | Freq: Every day | VAGINAL | 1 refills | Status: AC
  Filled 2021-02-24: qty 45, 7d supply, fill #0

## 2021-02-24 MED ORDER — FREESTYLE LITE W/DEVICE KIT
PACK | 0 refills | Status: AC
  Filled 2021-02-24: qty 1, 1d supply, fill #0

## 2021-02-27 ENCOUNTER — Other Ambulatory Visit (HOSPITAL_COMMUNITY): Payer: Self-pay

## 2021-02-27 DIAGNOSIS — E1169 Type 2 diabetes mellitus with other specified complication: Secondary | ICD-10-CM | POA: Diagnosis not present

## 2021-02-27 MED ORDER — RYBELSUS 3 MG PO TABS
1.0000 | ORAL_TABLET | Freq: Every day | ORAL | 0 refills | Status: DC
  Filled 2021-02-27: qty 30, 30d supply, fill #0

## 2021-02-27 MED ORDER — RYBELSUS 7 MG PO TABS: 1.0000 | ORAL_TABLET | Freq: Every day | ORAL | 5 refills | Status: DC

## 2021-03-19 ENCOUNTER — Other Ambulatory Visit (HOSPITAL_COMMUNITY): Payer: Self-pay

## 2021-03-19 MED ORDER — OZEMPIC (0.25 OR 0.5 MG/DOSE) 2 MG/1.5ML ~~LOC~~ SOPN
0.2500 mg | PEN_INJECTOR | SUBCUTANEOUS | 1 refills | Status: DC
  Filled 2021-03-19: qty 1.5, 28d supply, fill #0
  Filled 2021-05-02: qty 1.5, 28d supply, fill #1

## 2021-03-19 MED ORDER — GLIPIZIDE ER 5 MG PO TB24
5.0000 mg | ORAL_TABLET | Freq: Every day | ORAL | 1 refills | Status: DC
  Filled 2021-03-19: qty 30, 30d supply, fill #0

## 2021-03-31 ENCOUNTER — Other Ambulatory Visit (HOSPITAL_COMMUNITY): Payer: Self-pay

## 2021-03-31 MED ORDER — PANTOPRAZOLE SODIUM 40 MG PO TBEC
40.0000 mg | DELAYED_RELEASE_TABLET | Freq: Every day | ORAL | 1 refills | Status: DC
  Filled 2021-03-31 – 2021-04-09 (×2): qty 90, 90d supply, fill #0
  Filled 2021-10-30 – 2021-11-10 (×2): qty 90, 90d supply, fill #1

## 2021-04-08 ENCOUNTER — Other Ambulatory Visit (HOSPITAL_COMMUNITY): Payer: Self-pay

## 2021-04-09 ENCOUNTER — Other Ambulatory Visit (HOSPITAL_COMMUNITY): Payer: Self-pay

## 2021-04-27 ENCOUNTER — Other Ambulatory Visit (HOSPITAL_COMMUNITY): Payer: Self-pay

## 2021-05-04 ENCOUNTER — Other Ambulatory Visit (HOSPITAL_COMMUNITY): Payer: Self-pay

## 2021-05-05 DIAGNOSIS — E1169 Type 2 diabetes mellitus with other specified complication: Secondary | ICD-10-CM | POA: Diagnosis not present

## 2021-05-05 DIAGNOSIS — B001 Herpesviral vesicular dermatitis: Secondary | ICD-10-CM | POA: Diagnosis not present

## 2021-05-05 DIAGNOSIS — G43109 Migraine with aura, not intractable, without status migrainosus: Secondary | ICD-10-CM | POA: Diagnosis not present

## 2021-05-05 DIAGNOSIS — I10 Essential (primary) hypertension: Secondary | ICD-10-CM | POA: Diagnosis not present

## 2021-05-05 DIAGNOSIS — E785 Hyperlipidemia, unspecified: Secondary | ICD-10-CM | POA: Diagnosis not present

## 2021-05-05 DIAGNOSIS — K219 Gastro-esophageal reflux disease without esophagitis: Secondary | ICD-10-CM | POA: Diagnosis not present

## 2021-05-05 DIAGNOSIS — Z0000 Encounter for general adult medical examination without abnormal findings: Secondary | ICD-10-CM | POA: Diagnosis not present

## 2021-05-05 DIAGNOSIS — M25512 Pain in left shoulder: Secondary | ICD-10-CM | POA: Diagnosis not present

## 2021-05-06 ENCOUNTER — Encounter: Payer: Self-pay | Admitting: Neurology

## 2021-05-06 DIAGNOSIS — Z0279 Encounter for issue of other medical certificate: Secondary | ICD-10-CM

## 2021-05-18 DIAGNOSIS — Z01419 Encounter for gynecological examination (general) (routine) without abnormal findings: Secondary | ICD-10-CM | POA: Diagnosis not present

## 2021-05-18 DIAGNOSIS — Z1231 Encounter for screening mammogram for malignant neoplasm of breast: Secondary | ICD-10-CM | POA: Diagnosis not present

## 2021-05-18 DIAGNOSIS — Z6830 Body mass index (BMI) 30.0-30.9, adult: Secondary | ICD-10-CM | POA: Diagnosis not present

## 2021-05-21 ENCOUNTER — Other Ambulatory Visit (HOSPITAL_COMMUNITY): Payer: Self-pay

## 2021-05-21 MED ORDER — PEG 3350-KCL-NA BICARB-NACL 420 G PO SOLR
ORAL | 0 refills | Status: DC
  Filled 2021-05-21: qty 4000, 1d supply, fill #0

## 2021-05-25 ENCOUNTER — Other Ambulatory Visit (HOSPITAL_COMMUNITY): Payer: Self-pay

## 2021-05-26 DIAGNOSIS — Z1211 Encounter for screening for malignant neoplasm of colon: Secondary | ICD-10-CM | POA: Diagnosis not present

## 2021-05-26 DIAGNOSIS — D127 Benign neoplasm of rectosigmoid junction: Secondary | ICD-10-CM | POA: Diagnosis not present

## 2021-06-01 ENCOUNTER — Other Ambulatory Visit (HOSPITAL_COMMUNITY): Payer: Self-pay

## 2021-06-02 ENCOUNTER — Other Ambulatory Visit (HOSPITAL_COMMUNITY): Payer: Self-pay

## 2021-06-02 MED ORDER — OZEMPIC (0.25 OR 0.5 MG/DOSE) 2 MG/1.5ML ~~LOC~~ SOPN
0.2500 mg | PEN_INJECTOR | SUBCUTANEOUS | 1 refills | Status: DC
  Filled 2021-06-02: qty 1.5, 28d supply, fill #0
  Filled 2021-06-29: qty 1.5, 28d supply, fill #1

## 2021-06-03 ENCOUNTER — Other Ambulatory Visit (HOSPITAL_COMMUNITY): Payer: Self-pay

## 2021-06-03 MED ORDER — VALSARTAN 80 MG PO TABS
80.0000 mg | ORAL_TABLET | Freq: Every day | ORAL | 1 refills | Status: DC
  Filled 2021-06-03: qty 30, 30d supply, fill #0
  Filled 2021-07-21: qty 30, 30d supply, fill #1

## 2021-06-05 ENCOUNTER — Other Ambulatory Visit (HOSPITAL_COMMUNITY): Payer: Self-pay

## 2021-06-18 ENCOUNTER — Other Ambulatory Visit (HOSPITAL_COMMUNITY): Payer: Self-pay

## 2021-06-18 MED ORDER — HYDROCHLOROTHIAZIDE 12.5 MG PO TABS
12.5000 mg | ORAL_TABLET | Freq: Every day | ORAL | 0 refills | Status: DC | PRN
  Filled 2021-06-18: qty 30, 30d supply, fill #0

## 2021-06-29 ENCOUNTER — Other Ambulatory Visit (HOSPITAL_COMMUNITY): Payer: Self-pay

## 2021-07-22 ENCOUNTER — Other Ambulatory Visit (HOSPITAL_COMMUNITY): Payer: Self-pay

## 2021-08-03 ENCOUNTER — Other Ambulatory Visit (HOSPITAL_COMMUNITY): Payer: Self-pay

## 2021-08-05 ENCOUNTER — Other Ambulatory Visit (HOSPITAL_COMMUNITY): Payer: Self-pay

## 2021-08-05 MED ORDER — OZEMPIC (0.25 OR 0.5 MG/DOSE) 2 MG/3ML ~~LOC~~ SOPN
PEN_INJECTOR | SUBCUTANEOUS | 3 refills | Status: AC
  Filled 2021-08-05: qty 3, 28d supply, fill #0
  Filled 2021-08-25: qty 3, 28d supply, fill #1
  Filled 2021-09-27: qty 3, 28d supply, fill #2
  Filled 2021-10-26: qty 3, 28d supply, fill #3

## 2021-08-11 ENCOUNTER — Other Ambulatory Visit (HOSPITAL_COMMUNITY): Payer: Self-pay

## 2021-08-11 DIAGNOSIS — R600 Localized edema: Secondary | ICD-10-CM | POA: Diagnosis not present

## 2021-08-11 DIAGNOSIS — E785 Hyperlipidemia, unspecified: Secondary | ICD-10-CM | POA: Diagnosis not present

## 2021-08-11 DIAGNOSIS — I10 Essential (primary) hypertension: Secondary | ICD-10-CM | POA: Diagnosis not present

## 2021-08-11 DIAGNOSIS — E1169 Type 2 diabetes mellitus with other specified complication: Secondary | ICD-10-CM | POA: Diagnosis not present

## 2021-08-24 ENCOUNTER — Other Ambulatory Visit (HOSPITAL_COMMUNITY): Payer: Self-pay

## 2021-08-25 ENCOUNTER — Other Ambulatory Visit (HOSPITAL_COMMUNITY): Payer: Self-pay

## 2021-08-25 MED ORDER — VALSARTAN 80 MG PO TABS
80.0000 mg | ORAL_TABLET | Freq: Every day | ORAL | 3 refills | Status: DC
  Filled 2021-08-25: qty 30, 30d supply, fill #0
  Filled 2021-10-13: qty 30, 30d supply, fill #1
  Filled 2021-11-10: qty 30, 30d supply, fill #2
  Filled 2021-12-21: qty 30, 30d supply, fill #3

## 2021-08-27 ENCOUNTER — Other Ambulatory Visit (HOSPITAL_COMMUNITY): Payer: Self-pay

## 2021-09-18 ENCOUNTER — Ambulatory Visit: Payer: 59 | Admitting: Neurology

## 2021-09-21 NOTE — Progress Notes (Unsigned)
NEUROLOGY FOLLOW UP OFFICE NOTE  Jaime Rhodes [id number]  Assessment/Plan:   Vestibular migraine     Migraine prevention:  restart nortriptyline 4m at bedtime.  Increase to 530mat bedtime in 6 weeks if needed Migraine rescue:  rizatriptan 1064mt earliest onset of migraine Limit use of pain relievers to no more than 2 days out of week to prevent risk of rebound or medication-overuse headache. Keep headache diary Follow up 7-8 months.     Subjective:  Jaime Rhodes a [age]ar old right-handed female with HTN who follows up for postconcussion syndrome.    UPDATE: Restarted nortriptyline in December. 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  She was standing at her work station when she bent down to pick something up and when she stood up, she lost consciousness. She fell hitting her head and right shoulder on a dehumidifier.  She reports previous episode of syncope but did not have any warning before she passed out this time.  No convulsions, tongue biting, incontinence or postictal confusion..  She had not eaten since the previous afternoon.  CT head personally reviewed was negative for acute intracranial abnormality.  She was diagnosed in the ED with dehydration.  She saw outpatient cardiology and has an echocardiogram pending.     She was out of work for the first 3 weeks after the concussion.  She now is working on [employer].   Since the fall, she has had daily headaches and dizziness.  Headaches are diffuse and bilateral retro-orbital pulsations associated with seeing floaters and nausea.  They are sometimes preceded by feeling lightheaded.  She has a constant dull headache but severe headaches occur once a week and last 1/2 day to 1 day.  Moderate headaches occur 3-4  times a week and last about an hour with Tylenol.  She has history of migraines in her teens-20s.  She gets vertigo/spinning sensation when she has a severe migraine.  However, she has brief vertigo when she turns her head to the left.  Following the fall, she had postconcussion syndrome presenting as cognitive delay and dizziness, which subsequently resolved.  Routine awake and asleep EEG on 08/21/2020 was normal.   Past medications: Zofran  PAST MEDICAL HISTORY: Past Medical History:  Diagnosis Date   Acne rosacea    GERD (gastroesophageal reflux disease)    treated prn with nexium-will take dos   Herpes labialis    Hypertension    T2DM (type 2 diabetes mellitus) ([facility]) [place] MEDICATIONS: Current Outpatient Medications on File Prior to Visit  Medication Sig Dispense Refill   Blood Glucose Monitoring Suppl (FREESTYLE LITE) w/Device KIT Use as directed 1 kit 0   cetirizine (ZYRTEC) 10 MG tablet Can take one tablet by mouth one to two times daily as directed. 60 tablet 5   fluconazole (DIFLUCAN) 150 MG tablet Take 1 tablet (150 mg total) by mouth as directed once, may take the second tablet if symptoms appear again after 72 hours 2 tablet 0   glipiZIDE (GLUCOTROL XL) 5 MG 24 hr tablet Take 1 tablet (5 mg total) by mouth daily with food 30 tablet 1   glucose blood (FREESTYLE LITE) test strip Use to check sugars daily 100 each 3   hydrochlorothiazide (  HYDRODIURIL) 12.5 MG tablet Take 1 tablet (12.5 mg total) by mouth daily as needed. 30 tablet 0   Lancets (FREESTYLE) lancets Use to check sugars daily 100 each 3   meloxicam (MOBIC) 15 MG tablet Take 1 tablet (15 mg total) by mouth daily as needed. 30 tablet 0   metFORMIN (GLUCOPHAGE) 500 MG tablet Take 1 tablet (500 mg total) by mouth daily. 90 tablet 1   metoprolol succinate (TOPROL-XL) 50 MG 24 hr tablet TAKE 1 TABLET BY MOUTH ONCE DAILY 90 tablet 1   metoprolol succinate (TOPROL-XL) 50 MG 24 hr tablet TAKE 1 TABLET BY MOUTH ONCE DAILY. 90  tablet 1   metoprolol succinate (TOPROL-XL) 50 MG 24 hr tablet Take 1 tablet (50 mg total) by mouth daily. 90 tablet 1   mometasone (ELOCON) 0.1 % cream Apply to skin lesion twice daily until resolved. (Patient not taking: Reported on 08/14/2020) 90 g 5   nortriptyline (PAMELOR) 25 MG capsule Take 1 capsule (25 mg total) by mouth at bedtime. 30 capsule 5   nystatin-triamcinolone (MYCOLOG II) cream APPLY TO THE AFFECTED AREA(S) BY TOPICAL ROUTE 2 TIMES PER DAY IN THEMORNING AND EVENING 30 g 1   ondansetron (ZOFRAN ODT) 4 MG disintegrating tablet Take 1 tablet (4 mg total) by mouth every 8 (eight) hours as needed for nausea or vomiting. (Patient not taking: Reported on 02/18/2021) 20 tablet 5   pantoprazole (PROTONIX) 40 MG tablet Take 1 tablet (40 mg total) by mouth daily. 90 tablet 1   polyethylene glycol-electrolytes (NULYTELY) 420 g solution Use as directed 4000 mL 0   rizatriptan (MAXALT-MLT) 10 MG disintegrating tablet Take 1 tablet (10 mg total) by mouth as needed for migraine.  May repeat in 2 hours if needed.  Maximum 2 tablets in 24 hours. 10 tablet 5   rosuvastatin (CRESTOR) 10 MG tablet TAKE 1 TABLET BY MOUTH AT BEDTIME 90 tablet 1   rosuvastatin (CRESTOR) 10 MG tablet Take 1 tablet (10 mg total) by mouth at bedtime. 90 tablet 1   Semaglutide (RYBELSUS) 3 MG TABS Take 1 tablet by mouth at least 30 minutes before first food, beverage or other oral medicine of the day. 30 tablet 0   Semaglutide (RYBELSUS) 7 MG TABS Take 1 tablet by mouth at least 30 minutes before first food, beverage or other oral medicine of the day. 30 tablet 5   Semaglutide,0.25 or 0.5MG/DOS, (OZEMPIC, 0.25 OR 0.5 MG/DOSE,) 2 MG/3ML SOPN Inject 0.25 mg into the skin once a week for 28 days, If tolerating increase to 0.11m once a week 3 mL 3   valACYclovir (VALTREX) 500 MG tablet Take 500 mg by mouth daily as needed. For fever blister     valsartan (DIOVAN) 80 MG tablet Take 1 tablet (80 mg total) by mouth daily. 30 tablet 3    valsartan-hydrochlorothiazide (DIOVAN-HCT) 320-25 MG per tablet Take 1 tablet by mouth daily.     valsartan-hydrochlorothiazide (DIOVAN-HCT) 320-25 MG tablet TAKE 1 TABLET BY MOUTH ONCE A DAY 90 tablet 1   valsartan-hydrochlorothiazide (DIOVAN-HCT) 320-25 MG tablet TAKE 1 TABLET BY MOUTH ONCE DAILY. 90 tablet 1   valsartan-hydrochlorothiazide (DIOVAN-HCT) 320-25 MG tablet Take 1 tablet by mouth daily. 90 tablet 1   No current facility-administered medications on file prior to visit.    ALLERGIES: Allergies  Allergen Reactions   Penicillins Rash    FAMILY HISTORY: Family History  Problem Relation Age of Onset   Heart attack Sister    Heart disease Sister  Stroke Sister    Stroke Maternal Grandmother       Objective:  *** General: No acute distress.  Patient appears well-groomed.   Head:  Normocephalic/atraumatic Eyes:  Fundi examined but not visualized Neck: supple, no paraspinal tenderness, full range of motion Heart:  Regular rate and rhythm Lungs:  Clear to auscultation bilaterally Back: No paraspinal tenderness Neurological Exam: alert and oriented to person, place, and time.  Speech fluent and not dysarthric, language intact.  CN II-XII intact. Bulk and tone normal, muscle strength 5/5 throughout.  Sensation to light touch intact.  Deep tendon reflexes 2+ throughout, toes downgoing.  Finger to nose testing intact.  Gait normal, Romberg negative.   Metta Clines, DO  CC: Marilynne Drivers, PA

## 2021-09-22 ENCOUNTER — Other Ambulatory Visit (HOSPITAL_COMMUNITY): Payer: Self-pay

## 2021-09-22 ENCOUNTER — Ambulatory Visit (INDEPENDENT_AMBULATORY_CARE_PROVIDER_SITE_OTHER): Payer: 59 | Admitting: Neurology

## 2021-09-22 ENCOUNTER — Encounter: Payer: Self-pay | Admitting: Neurology

## 2021-09-22 VITALS — BP 117/80 | HR 119 | Ht 67.0 in | Wt 183.0 lb

## 2021-09-22 DIAGNOSIS — G43809 Other migraine, not intractable, without status migrainosus: Secondary | ICD-10-CM

## 2021-09-22 MED ORDER — RIZATRIPTAN BENZOATE 10 MG PO TBDP
10.0000 mg | ORAL_TABLET | ORAL | 5 refills | Status: DC | PRN
  Filled 2021-09-22: qty 10, 30d supply, fill #0
  Filled 2021-11-29: qty 10, 30d supply, fill #1

## 2021-09-22 MED ORDER — NORTRIPTYLINE HCL 50 MG PO CAPS
50.0000 mg | ORAL_CAPSULE | Freq: Every day | ORAL | 5 refills | Status: DC
  Filled 2021-09-22: qty 60, 60d supply, fill #0

## 2021-09-22 NOTE — Addendum Note (Signed)
Addended by: Renae Gloss on: 09/22/2021 11:28 AM   Modules accepted: Orders

## 2021-09-22 NOTE — Patient Instructions (Signed)
Increase nortriptyline to '50mg'$  at bedtime.  If no improvement in 4-6 weeks, contact me Refilled rizatriptan Limit use of pain relievers to no more than 2 days out of week to prevent risk of rebound or medication-overuse headache. Keep headache diary Follow up 4 months.

## 2021-09-28 ENCOUNTER — Other Ambulatory Visit (HOSPITAL_COMMUNITY): Payer: Self-pay

## 2021-10-01 ENCOUNTER — Encounter: Payer: Self-pay | Admitting: Neurology

## 2021-10-02 ENCOUNTER — Other Ambulatory Visit: Payer: Self-pay | Admitting: Neurology

## 2021-10-02 ENCOUNTER — Other Ambulatory Visit (HOSPITAL_COMMUNITY): Payer: Self-pay

## 2021-10-02 MED ORDER — TOPIRAMATE 25 MG PO TABS
25.0000 mg | ORAL_TABLET | Freq: Every day | ORAL | 3 refills | Status: DC
  Filled 2021-10-02: qty 30, 30d supply, fill #0
  Filled 2021-10-30 – 2021-11-10 (×2): qty 30, 30d supply, fill #1
  Filled 2021-12-06: qty 30, 30d supply, fill #2
  Filled 2022-01-12: qty 30, 30d supply, fill #3

## 2021-10-02 NOTE — Progress Notes (Signed)
Discontinue nortriptyline due to side effects

## 2021-10-06 DIAGNOSIS — E119 Type 2 diabetes mellitus without complications: Secondary | ICD-10-CM | POA: Diagnosis not present

## 2021-10-13 ENCOUNTER — Other Ambulatory Visit (HOSPITAL_COMMUNITY): Payer: Self-pay

## 2021-10-14 ENCOUNTER — Other Ambulatory Visit (HOSPITAL_COMMUNITY): Payer: Self-pay

## 2021-10-14 MED ORDER — ROSUVASTATIN CALCIUM 10 MG PO TABS
10.0000 mg | ORAL_TABLET | Freq: Every day | ORAL | 2 refills | Status: DC
  Filled 2021-10-14: qty 90, 90d supply, fill #0
  Filled 2022-01-12: qty 90, 90d supply, fill #1
  Filled 2022-05-17: qty 90, 90d supply, fill #2

## 2021-10-26 ENCOUNTER — Other Ambulatory Visit (HOSPITAL_COMMUNITY): Payer: Self-pay

## 2021-10-28 ENCOUNTER — Encounter: Payer: Self-pay | Admitting: Interventional Cardiology

## 2021-10-30 ENCOUNTER — Other Ambulatory Visit (HOSPITAL_COMMUNITY): Payer: Self-pay

## 2021-10-30 DIAGNOSIS — M25511 Pain in right shoulder: Secondary | ICD-10-CM | POA: Diagnosis not present

## 2021-11-04 ENCOUNTER — Other Ambulatory Visit (HOSPITAL_COMMUNITY): Payer: Self-pay

## 2021-11-04 DIAGNOSIS — Z0279 Encounter for issue of other medical certificate: Secondary | ICD-10-CM

## 2021-11-09 DIAGNOSIS — M25511 Pain in right shoulder: Secondary | ICD-10-CM | POA: Diagnosis not present

## 2021-11-10 ENCOUNTER — Other Ambulatory Visit (HOSPITAL_COMMUNITY): Payer: Self-pay

## 2021-11-13 DIAGNOSIS — M7501 Adhesive capsulitis of right shoulder: Secondary | ICD-10-CM | POA: Diagnosis not present

## 2021-11-23 ENCOUNTER — Other Ambulatory Visit (HOSPITAL_COMMUNITY): Payer: Self-pay

## 2021-11-26 ENCOUNTER — Other Ambulatory Visit (HOSPITAL_COMMUNITY): Payer: Self-pay

## 2021-11-26 MED ORDER — OZEMPIC (0.25 OR 0.5 MG/DOSE) 2 MG/3ML ~~LOC~~ SOPN
0.5000 mg | PEN_INJECTOR | SUBCUTANEOUS | 3 refills | Status: DC
  Filled 2021-11-26: qty 3, 28d supply, fill #0
  Filled 2021-12-21: qty 3, 28d supply, fill #1
  Filled 2022-01-12: qty 3, 28d supply, fill #2
  Filled 2022-02-15: qty 3, 28d supply, fill #3

## 2021-11-30 ENCOUNTER — Telehealth: Payer: Self-pay

## 2021-11-30 ENCOUNTER — Ambulatory Visit (INDEPENDENT_AMBULATORY_CARE_PROVIDER_SITE_OTHER): Payer: 59

## 2021-11-30 ENCOUNTER — Other Ambulatory Visit (HOSPITAL_COMMUNITY): Payer: Self-pay

## 2021-11-30 ENCOUNTER — Encounter: Payer: Self-pay | Admitting: Neurology

## 2021-11-30 DIAGNOSIS — G43709 Chronic migraine without aura, not intractable, without status migrainosus: Secondary | ICD-10-CM | POA: Diagnosis not present

## 2021-11-30 MED ORDER — METOCLOPRAMIDE HCL 5 MG/ML IJ SOLN
10.0000 mg | Freq: Once | INTRAMUSCULAR | Status: AC
  Administered 2021-11-30: 10 mg via INTRAMUSCULAR

## 2021-11-30 MED ORDER — DIPHENHYDRAMINE HCL 50 MG/ML IJ SOLN
50.0000 mg | Freq: Once | INTRAMUSCULAR | Status: AC
  Administered 2021-11-30: 25 mg via INTRAMUSCULAR

## 2021-11-30 MED ORDER — KETOROLAC TROMETHAMINE 60 MG/2ML IM SOLN
60.0000 mg | Freq: Once | INTRAMUSCULAR | Status: AC
  Administered 2021-11-30: 60 mg via INTRAMUSCULAR

## 2021-11-30 NOTE — Telephone Encounter (Signed)
Pt called informed Dr Tomi Likens said she could have a migraine cocktail she needs to be here by 4pm with a driver that comes up to the office with her, pt stated she will try to find someone to bring her,

## 2021-12-07 ENCOUNTER — Other Ambulatory Visit (HOSPITAL_COMMUNITY): Payer: Self-pay

## 2021-12-14 DIAGNOSIS — M7501 Adhesive capsulitis of right shoulder: Secondary | ICD-10-CM | POA: Diagnosis not present

## 2021-12-15 ENCOUNTER — Other Ambulatory Visit (HOSPITAL_COMMUNITY): Payer: Self-pay

## 2021-12-21 ENCOUNTER — Other Ambulatory Visit (HOSPITAL_COMMUNITY): Payer: Self-pay

## 2021-12-29 ENCOUNTER — Other Ambulatory Visit (HOSPITAL_COMMUNITY): Payer: Self-pay

## 2022-01-12 ENCOUNTER — Other Ambulatory Visit (HOSPITAL_COMMUNITY): Payer: Self-pay

## 2022-01-13 ENCOUNTER — Other Ambulatory Visit (HOSPITAL_COMMUNITY): Payer: Self-pay

## 2022-01-13 DIAGNOSIS — M7501 Adhesive capsulitis of right shoulder: Secondary | ICD-10-CM | POA: Diagnosis not present

## 2022-01-14 ENCOUNTER — Other Ambulatory Visit (HOSPITAL_COMMUNITY): Payer: Self-pay

## 2022-01-14 MED ORDER — VALSARTAN 80 MG PO TABS
80.0000 mg | ORAL_TABLET | Freq: Every day | ORAL | 2 refills | Status: DC
  Filled 2022-01-14: qty 30, 30d supply, fill #0
  Filled 2022-02-28: qty 30, 30d supply, fill #1
  Filled 2022-04-04: qty 30, 30d supply, fill #2

## 2022-01-25 NOTE — Progress Notes (Unsigned)
NEUROLOGY FOLLOW UP OFFICE NOTE  Jaime Rhodes [id number]  Assessment/Plan:   Vestibular migraine Chronic daily headache - may be associated with poor sleep hygiene/sleep schedule or possibly environmental at work (chemical?)     Migraine prevention:  increase topiramate to 82m at bedtime.  We can increase to 765mat bedtime in 4 weeks if needed. Migraine rescue: Stop rizatriptan.  Try sumatriptan 5032m00mg  Limit use of pain relievers to no more than 2 days out of week to prevent risk of rebound or medication-overuse headache. Keep headache diary Follow up 4 to 5 months.     Subjective:  Jaime Rhodes a 46 38ar old right-handed female with HTN who follows up for vestibular migraine   UPDATE: Increased nortriptyline to 33m54mst visit, but soon afterwards, she began feeling sluggish, disoriented and may have been change in blood pressure.  We discontinued nortriptyline in July to see if symptoms improved.  We started topiramate now.  Still with daily dull pressure around and between eyes and top of head.  Vertigo spells are less.  They occur once a week.  They last a few hours (no longer all day) except one time she needed a migraine cocktail.  Usually occurs after shift.  She took off 9 days and didn't have a headache (not sure if sleep was improved or no longer exposed to the chemicals at work).    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  She was standing at her work station when she bent down to pick something up and when she stood up, she lost consciousness. She fell hitting her head and right shoulder on a dehumidifier.  She reports previous episode of syncope but did not have any  warning before she passed out this time.  No convulsions, tongue biting, incontinence or postictal confusion..  She had not eaten since the previous afternoon.  CT head personally reviewed was negative for acute intracranial abnormality.  She was diagnosed in the ED with dehydration.  She saw outpatient cardiology and has an echocardiogram pending.     She was out of work for the first 3 weeks after the concussion.  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 [employer] works shift at 4AM.  [employer] to get her on a regular day shift, but it was denied.     Since the fall, she has had daily headaches and dizziness.  Headaches are diffuse and bilateral retro-orbital pulsations associated with seeing floaters and nausea.  They are sometimes preceded by feeling lightheaded.  She has a constant dull headache but severe headaches occur once a week and last 1/2 day to 1 day.  Moderate headaches occur 3-4 times a week and last about an hour with Tylenol.  She has history of migraines in her teens-20s.  She gets vertigo/spinning sensation when she has a severe migraine.  However, she has brief vertigo when she turns her head to the left.  Following the fall, she had postconcussion syndrome presenting as cognitive delay and dizziness, which subsequently resolved.  Routine awake and asleep EEG on 08/21/2020 was normal.   Past antiemetic: Zofran Past  antihypertensive:  metoprolol  PAST MEDICAL HISTORY: Past Medical History:  Diagnosis Date   Acne rosacea    GERD (gastroesophageal reflux disease)    treated prn with nexium-will take dos   Herpes labialis    Hypertension    T2DM (type 2 diabetes mellitus) ([place])     MEDICATIONS: Current Outpatient Medications on File Prior to Visit  Medication Sig Dispense Refill   Blood Glucose Monitoring Suppl (FREESTYLE LITE) w/Device KIT Use as directed 1 kit 0   cetirizine (ZYRTEC) 10 MG tablet Can take one tablet by mouth one to two times daily as  directed. (Patient not taking: Reported on 09/22/2021) 60 tablet 5   fluconazole (DIFLUCAN) 150 MG tablet Take 1 tablet (150 mg total) by mouth as directed once, may take the second tablet if symptoms appear again after 72 hours (Patient not taking: Reported on 09/22/2021) 2 tablet 0   glipiZIDE (GLUCOTROL XL) 5 MG 24 hr tablet Take 1 tablet (5 mg total) by mouth daily with food (Patient not taking: Reported on 09/22/2021) 30 tablet 1   glucose blood (FREESTYLE LITE) test strip Use to check sugars daily 100 each 3   Lancets (FREESTYLE) lancets Use to check sugars daily 100 each 3   meloxicam (MOBIC) 15 MG tablet Take 1 tablet (15 mg total) by mouth daily as needed. 30 tablet 0   metoprolol succinate (TOPROL-XL) 50 MG 24 hr tablet TAKE 1 TABLET BY MOUTH ONCE DAILY 90 tablet 1   metoprolol succinate (TOPROL-XL) 50 MG 24 hr tablet TAKE 1 TABLET BY MOUTH ONCE DAILY. 90 tablet 1   metoprolol succinate (TOPROL-XL) 50 MG 24 hr tablet Take 1 tablet (50 mg total) by mouth daily. (Patient not taking: Reported on 09/22/2021) 90 tablet 1   mometasone (ELOCON) 0.1 % cream Apply to skin lesion twice daily until resolved. (Patient not taking: Reported on 08/14/2020) 90 g 5   nystatin-triamcinolone (MYCOLOG II) cream APPLY TO THE AFFECTED AREA(S) BY TOPICAL ROUTE 2 TIMES PER DAY IN THEMORNING AND EVENING 30 g 1   ondansetron (ZOFRAN ODT) 4 MG disintegrating tablet Take 1 tablet (4 mg total) by mouth every 8 (eight) hours as needed for nausea or vomiting. (Patient not taking: Reported on 02/18/2021) 20 tablet 5   pantoprazole (PROTONIX) 40 MG tablet Take 1 tablet (40 mg total) by mouth daily. 90 tablet 1   polyethylene glycol-electrolytes (NULYTELY) 420 g solution Use as directed (Patient not taking: Reported on 09/22/2021) 4000 mL 0   rizatriptan (MAXALT-MLT) 10 MG disintegrating tablet Take 1 tablet (10 mg total) by mouth as needed for migraine.  May repeat in 2 hours if needed.  Maximum 2 tablets in 24 hours. 10 tablet 5    rosuvastatin (CRESTOR) 10 MG tablet TAKE 1 TABLET BY MOUTH AT BEDTIME 90 tablet 1   rosuvastatin (CRESTOR) 10 MG tablet Take 1 tablet (10 mg total) by mouth at bedtime. 90 tablet 2   Semaglutide,0.25 or 0.5MG/DOS, (OZEMPIC, 0.25 OR 0.5 MG/DOSE,) 2 MG/3ML SOPN Inject 0.5 mg into the skin once a week. 3 mL 3   topiramate (TOPAMAX) 25 MG tablet Take 1 tablet (25 mg total) by mouth at bedtime. 30 tablet 3   valACYclovir (VALTREX) 500 MG tablet Take 500 mg by mouth daily as needed. For fever blister     valsartan (DIOVAN) 80 MG tablet Take 1 tablet (80 mg total) by mouth daily. 30 tablet 2   valsartan-hydrochlorothiazide (DIOVAN-HCT) 320-25 MG per tablet Take 1 tablet by mouth daily. (Patient  not taking: Reported on 09/22/2021)     valsartan-hydrochlorothiazide (DIOVAN-HCT) 320-25 MG tablet TAKE 1 TABLET BY MOUTH ONCE A DAY 90 tablet 1   valsartan-hydrochlorothiazide (DIOVAN-HCT) 320-25 MG tablet TAKE 1 TABLET BY MOUTH ONCE DAILY. 90 tablet 1   valsartan-hydrochlorothiazide (DIOVAN-HCT) 320-25 MG tablet Take 1 tablet by mouth daily. (Patient not taking: Reported on 09/22/2021) 90 tablet 1   No current facility-administered medications on file prior to visit.    ALLERGIES: Allergies  Allergen Reactions   Metformin And Related    Semaglutide     Rybelsus   Penicillins Rash    FAMILY HISTORY: Family History  Problem Relation Age of Onset   Cancer - Prostate Father    Heart attack Sister    Heart disease Sister    Stroke Sister    Stroke Maternal Grandmother       Objective:  Blood pressure 117/79, pulse (!) 107, height _0  (1.702 m), weight 176 lb (79.8 kg), last menstrual period 01/23/2012, SpO2 98 %. General: No acute distress.  Patient appears well-groomed.      Metta Clines, DO  CC: Marilynne Drivers, PA

## 2022-01-26 ENCOUNTER — Other Ambulatory Visit (HOSPITAL_COMMUNITY): Payer: Self-pay

## 2022-01-26 ENCOUNTER — Ambulatory Visit (INDEPENDENT_AMBULATORY_CARE_PROVIDER_SITE_OTHER): Payer: 59 | Admitting: Neurology

## 2022-01-26 ENCOUNTER — Encounter: Payer: Self-pay | Admitting: Neurology

## 2022-01-26 VITALS — BP 117/79 | HR 107 | Ht 67.0 in | Wt 176.0 lb

## 2022-01-26 DIAGNOSIS — R519 Headache, unspecified: Secondary | ICD-10-CM | POA: Diagnosis not present

## 2022-01-26 DIAGNOSIS — G43809 Other migraine, not intractable, without status migrainosus: Secondary | ICD-10-CM

## 2022-01-26 MED ORDER — TOPIRAMATE 50 MG PO TABS
50.0000 mg | ORAL_TABLET | Freq: Every day | ORAL | 5 refills | Status: DC
  Filled 2022-01-26: qty 30, 30d supply, fill #0
  Filled 2022-02-28: qty 30, 30d supply, fill #1
  Filled 2022-04-04: qty 30, 30d supply, fill #2
  Filled 2022-06-07 – 2022-06-08 (×2): qty 30, 30d supply, fill #3

## 2022-01-26 MED ORDER — SUMATRIPTAN SUCCINATE 100 MG PO TABS
100.0000 mg | ORAL_TABLET | ORAL | 5 refills | Status: DC | PRN
  Filled 2022-01-26: qty 9, 30d supply, fill #0
  Filled 2022-02-28: qty 9, 30d supply, fill #1

## 2022-01-26 NOTE — Patient Instructions (Signed)
Increase topiramate to '50mg'$  at bedtime.  If no improvement in 6 weeks, contact me Stop rizatriptan.  Instead, try sumatriptan '100mg'$  earliest onset of migraine.  May repeat in 2 hours (maximum 2 in 24 hours).  If too strong, try taking 1/2 tablet.  If ineffective, contact me Limit use of pain relievers to no more than 2 days out of week to prevent risk of rebound or medication-overuse headache. Keep headache diary Follow up 4 to 5 months.

## 2022-01-29 ENCOUNTER — Other Ambulatory Visit: Payer: Self-pay | Admitting: Neurology

## 2022-01-29 ENCOUNTER — Other Ambulatory Visit (HOSPITAL_COMMUNITY): Payer: Self-pay

## 2022-01-29 MED ORDER — ONDANSETRON 4 MG PO TBDP
4.0000 mg | ORAL_TABLET | Freq: Three times a day (TID) | ORAL | 0 refills | Status: DC | PRN
  Filled 2022-01-29: qty 20, 7d supply, fill #0

## 2022-02-16 ENCOUNTER — Other Ambulatory Visit (HOSPITAL_COMMUNITY): Payer: Self-pay

## 2022-03-16 ENCOUNTER — Other Ambulatory Visit (HOSPITAL_COMMUNITY): Payer: Self-pay

## 2022-03-16 MED ORDER — MELOXICAM 15 MG PO TABS
15.0000 mg | ORAL_TABLET | Freq: Every day | ORAL | 0 refills | Status: DC | PRN
  Filled 2022-03-16: qty 30, 30d supply, fill #0

## 2022-03-17 ENCOUNTER — Other Ambulatory Visit (HOSPITAL_COMMUNITY): Payer: Self-pay

## 2022-03-17 MED ORDER — PANTOPRAZOLE SODIUM 40 MG PO TBEC
40.0000 mg | DELAYED_RELEASE_TABLET | Freq: Every day | ORAL | 0 refills | Status: DC
  Filled 2022-03-17: qty 90, 90d supply, fill #0

## 2022-03-18 ENCOUNTER — Other Ambulatory Visit (HOSPITAL_COMMUNITY): Payer: Self-pay

## 2022-03-18 MED ORDER — OZEMPIC (0.25 OR 0.5 MG/DOSE) 2 MG/3ML ~~LOC~~ SOPN
0.5000 mg | PEN_INJECTOR | SUBCUTANEOUS | 1 refills | Status: DC
  Filled 2022-03-18: qty 3, 28d supply, fill #0
  Filled 2022-04-19: qty 3, 28d supply, fill #1

## 2022-03-29 DIAGNOSIS — M7501 Adhesive capsulitis of right shoulder: Secondary | ICD-10-CM | POA: Diagnosis not present

## 2022-04-02 ENCOUNTER — Other Ambulatory Visit (HOSPITAL_COMMUNITY): Payer: Self-pay

## 2022-04-02 MED ORDER — VALACYCLOVIR HCL 500 MG PO TABS
500.0000 mg | ORAL_TABLET | Freq: Two times a day (BID) | ORAL | 0 refills | Status: DC | PRN
  Filled 2022-04-02: qty 60, 30d supply, fill #0

## 2022-05-03 DIAGNOSIS — Z0279 Encounter for issue of other medical certificate: Secondary | ICD-10-CM

## 2022-05-05 ENCOUNTER — Encounter: Payer: Self-pay | Admitting: Neurology

## 2022-05-10 IMAGING — MR MR HEAD WO/W CM
13 series · 48 of 48 positions shown · IV contrast (multihance)
Comparison: Head CT 05/08/2020

CLINICAL DATA: Syncope with fall on 05/08/2020. Headache. Abnormal
head CT.

EXAM:
MRI HEAD WITHOUT AND WITH CONTRAST
TECHNIQUE: Multiplanar, multiecho pulse sequences of the brain and surrounding
structures were obtained without and with intravenous contrast.
CONTRAST:  20mL MULTIHANCE GADOBENATE DIMEGLUMINE 529 MG/ML IV SOLN

[Series 2: T1 · sagittal · 5.0mm · 0.45mm/px · 2 of 23 slices shown]
[im 1/23]
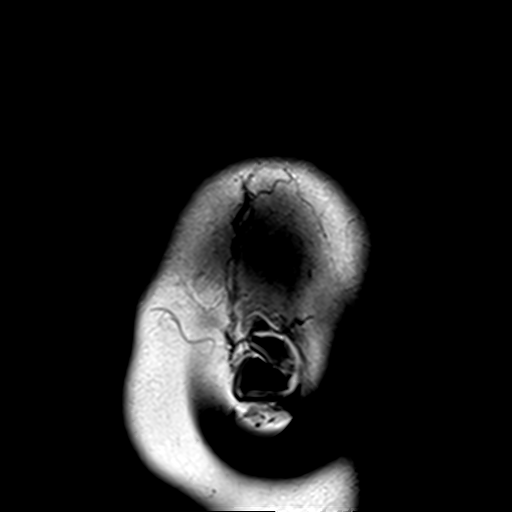
[im 23/23]
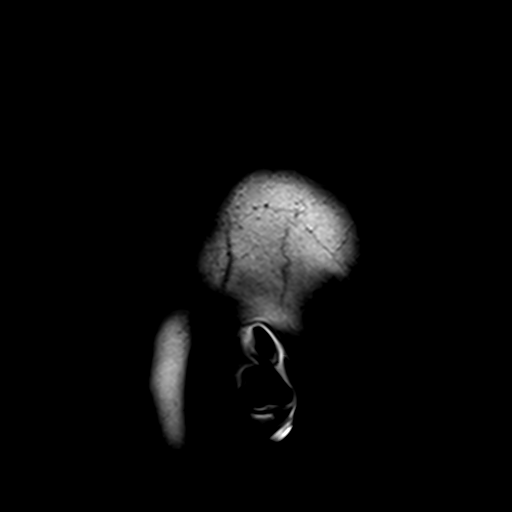

[Series 3: DWI · axial · 3.0mm · 1.80mm/px · z∈[-52,+95]mm · 7 of 100 slices shown (1 of 4)]
[im 1/100]
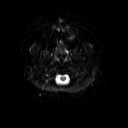
[im 17/100]
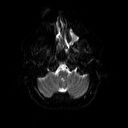
[im 34/100]
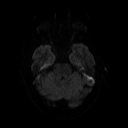
[im 50/100]
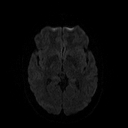
[im 67/100]
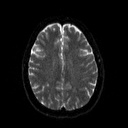
[im 83/100]
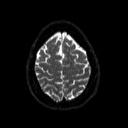
[im 100/100]
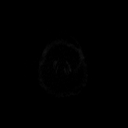

[Series 4: DWI · axial · 3.0mm · 1.80mm/px · z∈[-52,+95]mm · 3 of 46 slices shown (2 of 4)]
[im 1/46]
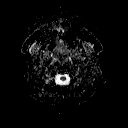
[im 23/46]
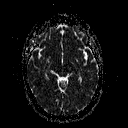
[im 46/46]
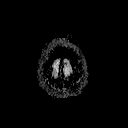

[Series 5: DWI · coronal · 5.0mm · 1.80mm/px · 5 of 72 slices shown (3 of 4)]
[im 1/72]
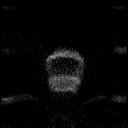
[im 18/72]
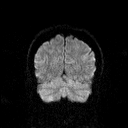
[im 36/72]
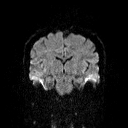
[im 54/72]
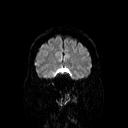
[im 72/72]
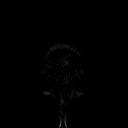

[Series 6: DWI · coronal · 5.0mm · 1.80mm/px · 2 of 35 slices shown (4 of 4)]
[im 1/35]
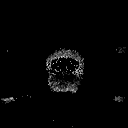
[im 35/35]
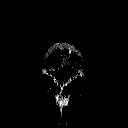

[Series 7: T2 · axial · 5.0mm · 0.60mm/px · 1 of 22 slices shown (1 of 2)]
[im 1/22]
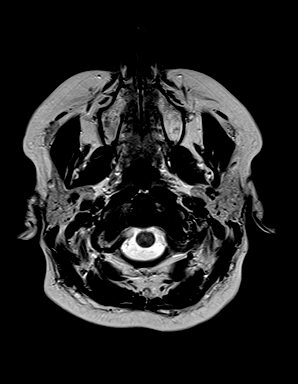

[Series 8: FLAIR · axial · 3.0mm · 0.45mm/px · z∈[-53,+96]mm · 2 of 33 slices shown]
[im 1/33]
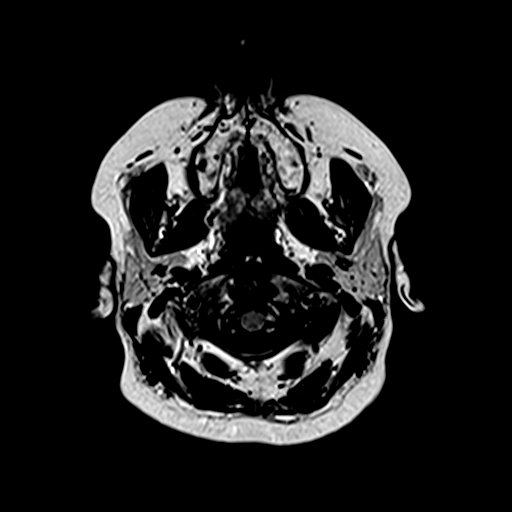
[im 33/33]
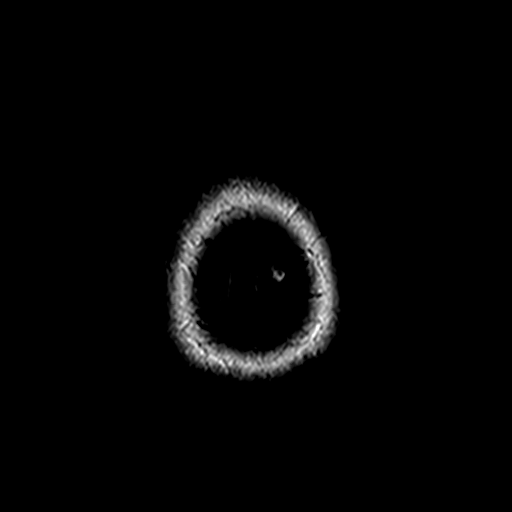

[Series 9: mip_images(sw) · axial · 32.0mm · 0.90mm/px · z∈[-34,+77]mm · 2 of 29 slices shown]
[im 1/29]
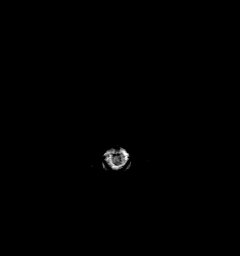
[im 29/29]
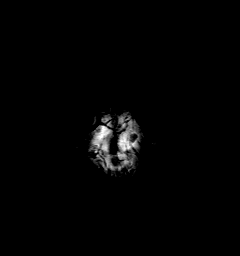

[Series 10: swi_images · axial · 4.0mm · 0.90mm/px · z∈[-48,+91]mm · 2 of 36 slices shown]
[im 1/36]
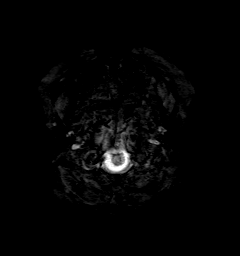
[im 36/36]
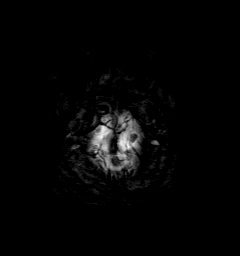

[Series 11: t1_mpr_tra · axial · 1.0mm · 0.75mm/px · z∈[-49,+93]mm · 9 of 144 slices shown]
[im 1/144]
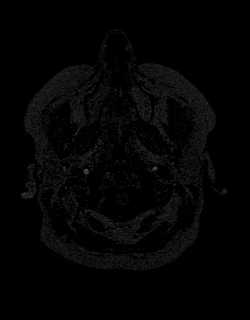
[im 18/144]
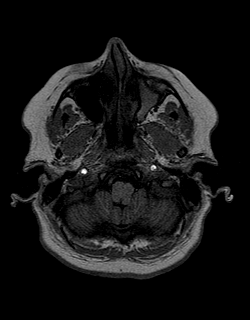
[im 36/144]
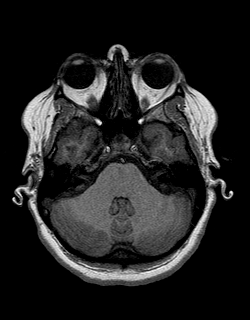
[im 54/144]
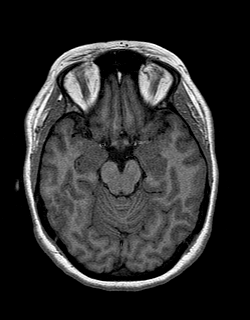
[im 72/144]
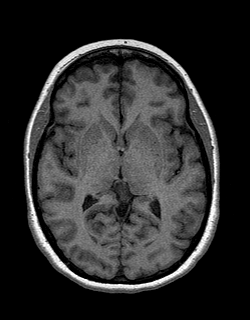
[im 90/144]
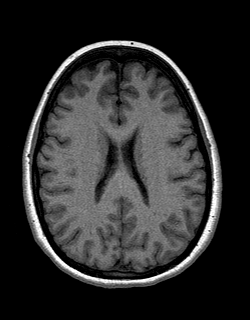
[im 108/144]
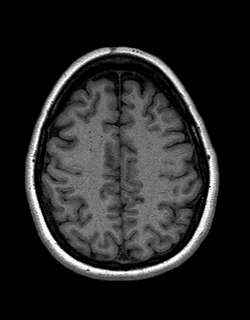
[im 126/144]
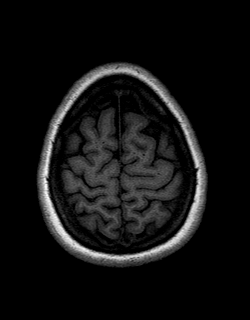
[im 144/144]
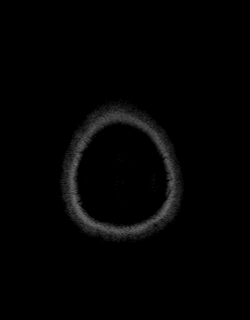

[Series 12: T2 · coronal · 5.0mm · 0.45mm/px · 2 of 28 slices shown (2 of 2)]
[im 1/28]
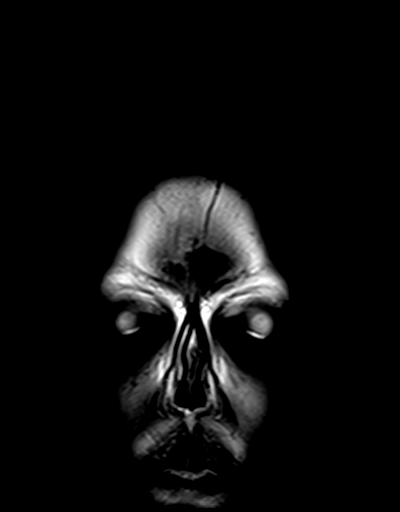
[im 28/28]
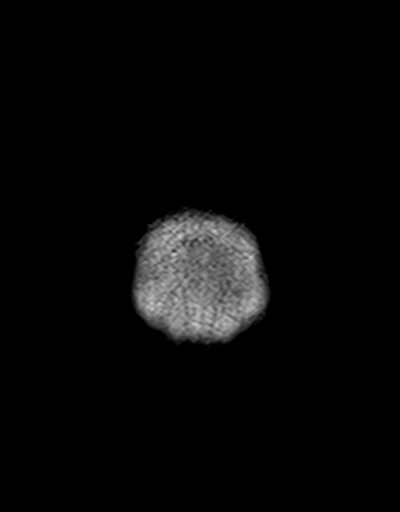

[Series 13: t1_mpr_tra post · axial · 1.0mm · 0.75mm/px · z∈[-49,+93]mm · 9 of 144 slices shown]
[im 1/144]
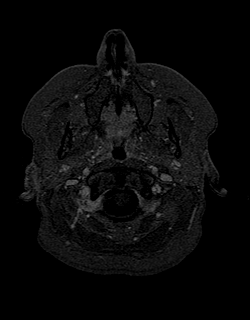
[im 18/144]
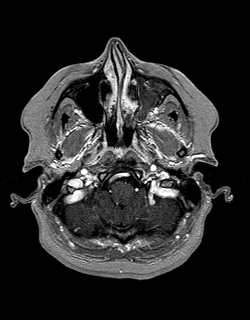
[im 36/144]
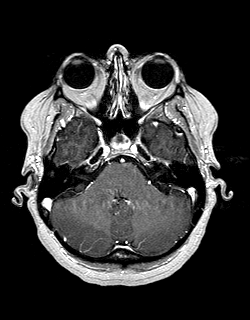
[im 54/144]
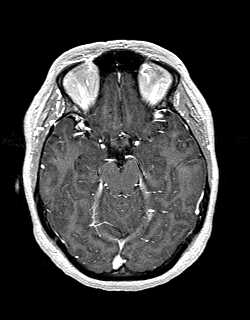
[im 72/144]
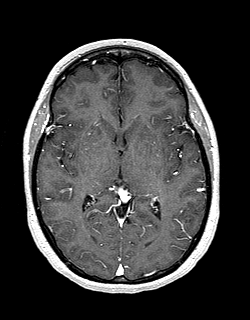
[im 90/144]
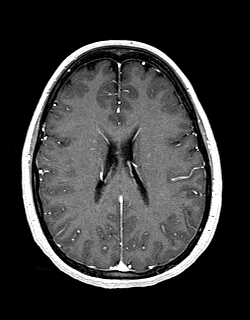
[im 108/144]
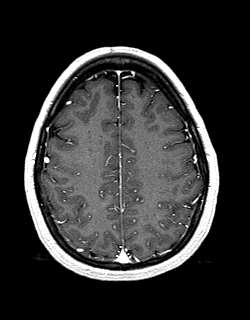
[im 126/144]
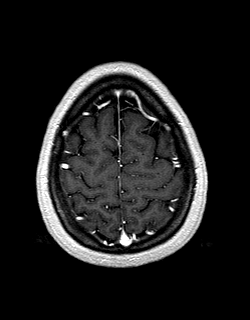
[im 144/144]
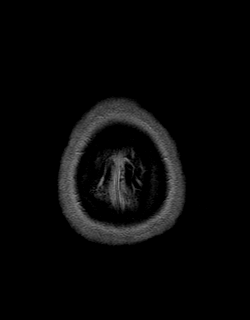

[Series 14: post cor · coronal · 5.0mm · 0.45mm/px · 2 of 28 slices shown]
[im 1/28]
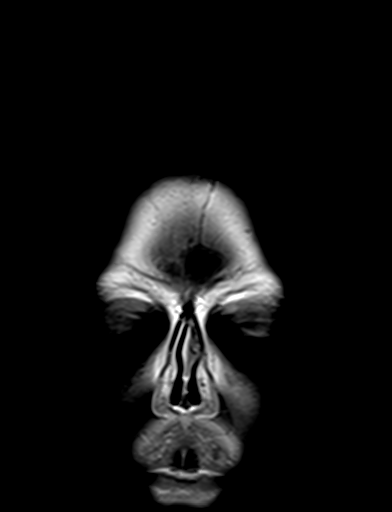
[im 28/28]
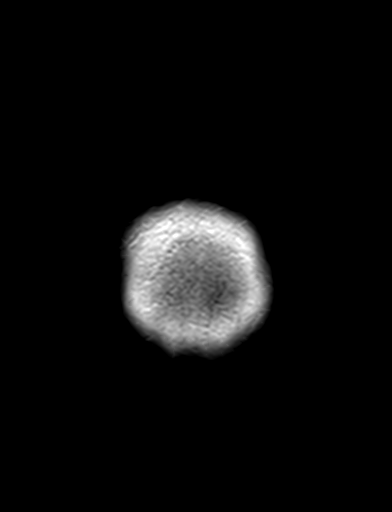

[48 of 48 positions shown; findings below may reference images not displayed]

FINDINGS: Brain: There is no evidence of an acute infarct, intracranial
hemorrhage, mass, midline shift, or extra-axial fluid collection.
The ventricles and sulci are normal. The brain is normal in signal.
No abnormal enhancement is identified. There is a moderately
expanded partially empty sella. The cerebellar tonsils are normally
positioned.

Vascular: Major intracranial vascular flow voids are preserved. The
major dural venous sinuses are enhancing, however there are possible
distal transverse sinus stenoses bilaterally.

Skull and upper cervical spine: Unremarkable bone marrow signal.

Sinuses/Orbits: Unremarkable orbits. Persistent complete
opacification of the left maxillary sinus. Trace left mastoid fluid.

Other: None.
IMPRESSION: 1. Partially empty sella and possible bilateral transverse sinus
stenoses. If there is clinical concern for idiopathic intracranial
hypertension, recommend correlation with lumbar puncture and opening
pressure.
2. Otherwise unremarkable appearance of the brain.
3. Chronic left maxillary sinusitis.

## 2022-05-11 ENCOUNTER — Other Ambulatory Visit: Payer: Self-pay | Admitting: Physician Assistant

## 2022-05-11 ENCOUNTER — Encounter: Payer: Self-pay | Admitting: Neurology

## 2022-05-11 DIAGNOSIS — Z0000 Encounter for general adult medical examination without abnormal findings: Secondary | ICD-10-CM | POA: Diagnosis not present

## 2022-05-11 DIAGNOSIS — B001 Herpesviral vesicular dermatitis: Secondary | ICD-10-CM | POA: Diagnosis not present

## 2022-05-11 DIAGNOSIS — G43109 Migraine with aura, not intractable, without status migrainosus: Secondary | ICD-10-CM | POA: Diagnosis not present

## 2022-05-11 DIAGNOSIS — K219 Gastro-esophageal reflux disease without esophagitis: Secondary | ICD-10-CM | POA: Diagnosis not present

## 2022-05-11 DIAGNOSIS — E785 Hyperlipidemia, unspecified: Secondary | ICD-10-CM | POA: Diagnosis not present

## 2022-05-11 DIAGNOSIS — I10 Essential (primary) hypertension: Secondary | ICD-10-CM | POA: Diagnosis not present

## 2022-05-11 DIAGNOSIS — E1169 Type 2 diabetes mellitus with other specified complication: Secondary | ICD-10-CM | POA: Diagnosis not present

## 2022-05-17 ENCOUNTER — Other Ambulatory Visit (HOSPITAL_COMMUNITY): Payer: Self-pay

## 2022-05-20 ENCOUNTER — Other Ambulatory Visit (HOSPITAL_COMMUNITY): Payer: Self-pay

## 2022-05-20 MED ORDER — VALSARTAN 80 MG PO TABS
80.0000 mg | ORAL_TABLET | Freq: Every day | ORAL | 3 refills | Status: DC
  Filled 2022-05-20: qty 90, 90d supply, fill #0
  Filled 2022-08-10: qty 90, 90d supply, fill #1
  Filled 2022-11-19: qty 90, 90d supply, fill #2
  Filled 2023-04-07: qty 90, 90d supply, fill #3

## 2022-05-20 MED ORDER — OZEMPIC (0.25 OR 0.5 MG/DOSE) 2 MG/3ML ~~LOC~~ SOPN
0.5000 mg | PEN_INJECTOR | SUBCUTANEOUS | 3 refills | Status: DC
  Filled 2022-05-20: qty 3, 28d supply, fill #0
  Filled 2022-06-07: qty 3, 28d supply, fill #1
  Filled 2022-07-14: qty 3, 28d supply, fill #2
  Filled 2022-08-17: qty 3, 28d supply, fill #3
  Filled 2022-09-15: qty 9, 84d supply, fill #4
  Filled 2022-11-19 – 2022-11-30 (×2): qty 9, 84d supply, fill #5

## 2022-06-07 ENCOUNTER — Other Ambulatory Visit (HOSPITAL_COMMUNITY): Payer: Self-pay

## 2022-06-08 ENCOUNTER — Other Ambulatory Visit (HOSPITAL_COMMUNITY): Payer: Self-pay

## 2022-06-09 DIAGNOSIS — M542 Cervicalgia: Secondary | ICD-10-CM | POA: Diagnosis not present

## 2022-06-09 DIAGNOSIS — M7501 Adhesive capsulitis of right shoulder: Secondary | ICD-10-CM | POA: Diagnosis not present

## 2022-06-09 DIAGNOSIS — M25511 Pain in right shoulder: Secondary | ICD-10-CM | POA: Diagnosis not present

## 2022-06-11 ENCOUNTER — Ambulatory Visit
Admission: RE | Admit: 2022-06-11 | Discharge: 2022-06-11 | Disposition: A | Discharge status: Home / Self Care | Payer: No Typology Code available for payment source | Source: Ambulatory Visit | Attending: Physician Assistant | Admitting: Physician Assistant

## 2022-06-11 DIAGNOSIS — E785 Hyperlipidemia, unspecified: Secondary | ICD-10-CM

## 2022-06-15 ENCOUNTER — Encounter (HOSPITAL_BASED_OUTPATIENT_CLINIC_OR_DEPARTMENT_OTHER): Payer: Self-pay | Admitting: Orthopedic Surgery

## 2022-06-15 ENCOUNTER — Other Ambulatory Visit: Payer: Self-pay

## 2022-06-15 NOTE — Progress Notes (Signed)
   06/15/22 1236  PAT Phone Screen  Is the patient taking a GLP-1 receptor agonist? Yes  Has the patient been informed on holding medication? (S)  Yes (last dose 06/07/22)  Do You Have Diabetes? Yes  Do You Have Hypertension? Yes  Have You Ever Been to the ER for Asthma? No  Have You Taken Oral Steroids in the Past 3 Months? No  Do you Take Phenteramine or any Other Diet Drugs? No  Recent  Lab Work, EKG, CXR? No  Do you have a history of heart problems? No  Any Recent Hospitalizations? No  Height 5\' 7"  (1.702 m)  Weight 76.7 kg  Pat Appointment Scheduled Yes (bmet ekg)

## 2022-06-21 ENCOUNTER — Encounter (HOSPITAL_BASED_OUTPATIENT_CLINIC_OR_DEPARTMENT_OTHER)
Admission: RE | Admit: 2022-06-21 | Discharge: 2022-06-21 | Disposition: A | Discharge status: Home / Self Care | Payer: 59 | Source: Ambulatory Visit | Attending: Orthopedic Surgery | Admitting: Orthopedic Surgery

## 2022-06-21 DIAGNOSIS — M19011 Primary osteoarthritis, right shoulder: Secondary | ICD-10-CM | POA: Diagnosis not present

## 2022-06-21 DIAGNOSIS — I10 Essential (primary) hypertension: Secondary | ICD-10-CM | POA: Diagnosis not present

## 2022-06-21 DIAGNOSIS — Z7985 Long-term (current) use of injectable non-insulin antidiabetic drugs: Secondary | ICD-10-CM | POA: Diagnosis not present

## 2022-06-21 DIAGNOSIS — Z794 Long term (current) use of insulin: Secondary | ICD-10-CM | POA: Diagnosis not present

## 2022-06-21 DIAGNOSIS — M7501 Adhesive capsulitis of right shoulder: Secondary | ICD-10-CM | POA: Diagnosis not present

## 2022-06-21 DIAGNOSIS — Z01818 Encounter for other preprocedural examination: Secondary | ICD-10-CM | POA: Insufficient documentation

## 2022-06-21 DIAGNOSIS — Z6826 Body mass index (BMI) 26.0-26.9, adult: Secondary | ICD-10-CM | POA: Diagnosis not present

## 2022-06-21 DIAGNOSIS — E119 Type 2 diabetes mellitus without complications: Secondary | ICD-10-CM | POA: Insufficient documentation

## 2022-06-21 DIAGNOSIS — E669 Obesity, unspecified: Secondary | ICD-10-CM | POA: Diagnosis not present

## 2022-06-21 DIAGNOSIS — K219 Gastro-esophageal reflux disease without esophagitis: Secondary | ICD-10-CM | POA: Diagnosis not present

## 2022-06-21 DIAGNOSIS — M7541 Impingement syndrome of right shoulder: Secondary | ICD-10-CM | POA: Diagnosis not present

## 2022-06-21 LAB — BASIC METABOLIC PANEL
Anion gap: 12 (ref 5–15)
BUN: 13 mg/dL (ref 6–20)
CO2: 17 mmol/L — ABNORMAL LOW (ref 22–32)
Calcium: 8.5 mg/dL — ABNORMAL LOW (ref 8.9–10.3)
Chloride: 109 mmol/L (ref 98–111)
Creatinine, Ser: 0.66 mg/dL (ref 0.44–1.00)
GFR, Estimated: >60 mL/min (ref >60)
Glucose, Bld: 84 mg/dL (ref 70–99)
Potassium: 4.6 mmol/L (ref 3.5–5.1)
Sodium: 138 mmol/L (ref 135–145)

## 2022-06-21 NOTE — Progress Notes (Signed)

## 2022-06-21 NOTE — H&P (Signed)
PREOPERATIVE H&P  Chief Complaint: right shoulder pain  HPI: Jaime Rhodes is a 47 y.o. female who presents for preoperative history and physical with a diagnosis of right shoulder pain. This has been fairly chronic.  We have done injections, anti-inflammatories and home exercises.  None of these have improved symptoms.  The pain is located around her shoulder blade and to some degree laterally.  This is significantly impairing activities of daily living.  She has elected for surgical management.   Past Medical History:  Diagnosis Date   Acne rosacea    GERD (gastroesophageal reflux disease)    treated prn with nexium-will take dos   Herpes labialis    Hypertension    T2DM (type 2 diabetes mellitus)    Past Surgical History:  Procedure Laterality Date   LAPAROSCOPIC ASSISTED VAGINAL HYSTERECTOMY  02/22/2012   Procedure: LAPAROSCOPIC ASSISTED VAGINAL HYSTERECTOMY;  Surgeon: Meriel Pica, MD;  Location: [facility];  Service: Gynecology;  Laterality: N/A;   WISDOM TOOTH EXTRACTION     Social History   Socioeconomic History   Marital status: Married    Spouse name: Not on file   Number of children: Not on file   Years of education: Not on file   Highest education level: Not on file  Occupational History   Not on file  Tobacco Use   Smoking status: Never   Smokeless tobacco: Never  Vaping Use   Vaping Use: Never used  Substance and Sexual Activity   Alcohol use: No   Drug use: No   Sexual activity: Yes  Other Topics Concern   Not on file  Social History Narrative   Right handed   Lives with husband and children in a two story home   Caffeine 1 cup daily   Social Determinants of Health   Financial Resource Strain: Not on file  Food Insecurity: Not on file  Transportation Needs: Not on file  Physical Activity: Not on file  Stress: Not on file  Social Connections: Not on file   Family History  Problem Relation Age of Onset   Cancer - Prostate Father    Heart  attack Sister    Heart disease Sister    Stroke Sister    Stroke Maternal Grandmother    Allergies  Allergen Reactions   Metformin And Related    Semaglutide     Rybelsus   Influenza Virus Vaccine Rash   Penicillins Rash   Prior to Admission medications   Medication Sig Start Date End Date Taking? Authorizing Provider  Blood Glucose Monitoring Suppl (FREESTYLE LITE) w/Device KIT Use as directed 02/24/21  Yes   glucose blood (FREESTYLE LITE) test strip Use to check sugars daily 02/24/21  Yes   Lancets (FREESTYLE) lancets Use to check sugars daily 02/24/21  Yes   meloxicam (MOBIC) 15 MG tablet Take 1 tablet (15 mg total) by mouth daily as needed. 03/16/22  Yes   nystatin-triamcinolone (MYCOLOG II) cream APPLY TO THE AFFECTED AREA(S) BY TOPICAL ROUTE 2 TIMES PER DAY IN [facility] AND EVENING 02/24/21  Yes   pantoprazole (PROTONIX) 40 MG tablet Take 1 tablet (40 mg total) by mouth daily. 03/17/22  Yes   rosuvastatin (CRESTOR) 10 MG tablet Take 1 tablet (10 mg total) by mouth at bedtime. 10/14/21  Yes   SUMAtriptan (IMITREX) 100 MG tablet Take 1 tablet (100 mg total) by mouth as needed for migraine. May repeat in 2 hours if headache persists or recurs.  Maximum 2 tablets in 24  hours. 01/26/22  Yes Everlena Cooper, Adam R, DO  topiramate (TOPAMAX) 50 MG tablet Take 1 tablet (50 mg total) by mouth at bedtime. 01/26/22  Yes Everlena Cooper, Adam R, DO  valACYclovir (VALTREX) 500 MG tablet Take 1 tablet (500 mg total) by mouth every 12 (twelve) hours as needed for fever blisters 04/02/22  Yes   valsartan (DIOVAN) 80 MG tablet Take 1 tablet (80 mg total) by mouth daily. 05/20/22  Yes   ondansetron (ZOFRAN-ODT) 4 MG disintegrating tablet Take 1 tablet (4 mg total) by mouth every 8 (eight) hours as needed for nausea or vomiting. 01/29/22   Drema Dallas, DO  Semaglutide,0.25 or 0.5MG /DOS, (OZEMPIC, 0.25 OR 0.5 MG/DOSE,) 2 MG/3ML SOPN Inject 0.5 mg into the skin once a week. 05/20/22        Positive ROS: All other systems have  been reviewed and were otherwise negative with the exception of those mentioned in the HPI and as above.  Physical Exam: General: Alert, no acute distress Cardiovascular: No pedal edema Respiratory: No cyanosis, no use of accessory musculature GI: No organomegaly, abdomen is soft and non-tender Skin: No lesions in the area of chief complaint Neurologic: Sensation intact distally Psychiatric: Patient is competent for consent with normal mood and affect Lymphatic: No axillary or cervical lymphadenopathy  MUSCULOSKELETAL: On exam right shoulder active motion is 0-170 degrees.  External rotation to 40 degrees.  She reports most of her pain being somewhat posterior.  She gets some pain over her [facility] joint as well.  Her supraspinatus strength is not bad.    Imaging: MRI demonstrates some cuff tendinopathy with severe arthropathy of the [facility] joint.  Assessment: Right shoulder pain and cuff tendinopathy, AC arthrosis and some element of frozen shoulder.   Plan: Plan for Procedure(s): SHOULDER ARTHROSCOPY WITH DISTAL CLAVICLE EXCISION SHOULDER ACROMIOPLASTY LYSIS OF ADHESION/MANIPULATION  The risks benefits and alternatives were discussed with the patient including but not limited to the risks of nonoperative treatment, versus surgical intervention including infection, bleeding, nerve injury,  blood clots, cardiopulmonary complications, morbidity, mortality, among others, and they were willing to proceed.    Armida Sans, PA-C    06/21/2022 9:25 AM

## 2022-06-22 ENCOUNTER — Encounter (HOSPITAL_BASED_OUTPATIENT_CLINIC_OR_DEPARTMENT_OTHER): Payer: Self-pay | Admitting: Orthopedic Surgery

## 2022-06-22 ENCOUNTER — Encounter (HOSPITAL_BASED_OUTPATIENT_CLINIC_OR_DEPARTMENT_OTHER)
Admission: RE | Disposition: A | Discharge status: Home / Self Care | Payer: Self-pay | Source: Ambulatory Visit | Attending: Orthopedic Surgery

## 2022-06-22 ENCOUNTER — Other Ambulatory Visit: Payer: Self-pay

## 2022-06-22 ENCOUNTER — Ambulatory Visit (HOSPITAL_BASED_OUTPATIENT_CLINIC_OR_DEPARTMENT_OTHER): Payer: 59 | Admitting: Anesthesiology

## 2022-06-22 ENCOUNTER — Other Ambulatory Visit (HOSPITAL_COMMUNITY): Payer: Self-pay

## 2022-06-22 ENCOUNTER — Ambulatory Visit (HOSPITAL_BASED_OUTPATIENT_CLINIC_OR_DEPARTMENT_OTHER)
Admission: RE | Admit: 2022-06-22 | Discharge: 2022-06-22 | Disposition: A | Discharge status: Home / Self Care | Payer: 59 | Source: Ambulatory Visit | Attending: Orthopedic Surgery | Admitting: Orthopedic Surgery

## 2022-06-22 DIAGNOSIS — G8918 Other acute postprocedural pain: Secondary | ICD-10-CM | POA: Diagnosis not present

## 2022-06-22 DIAGNOSIS — M19011 Primary osteoarthritis, right shoulder: Secondary | ICD-10-CM | POA: Insufficient documentation

## 2022-06-22 DIAGNOSIS — K219 Gastro-esophageal reflux disease without esophagitis: Secondary | ICD-10-CM | POA: Insufficient documentation

## 2022-06-22 DIAGNOSIS — M7541 Impingement syndrome of right shoulder: Secondary | ICD-10-CM | POA: Diagnosis not present

## 2022-06-22 DIAGNOSIS — M24111 Other articular cartilage disorders, right shoulder: Secondary | ICD-10-CM | POA: Diagnosis not present

## 2022-06-22 DIAGNOSIS — M7501 Adhesive capsulitis of right shoulder: Secondary | ICD-10-CM | POA: Insufficient documentation

## 2022-06-22 DIAGNOSIS — I10 Essential (primary) hypertension: Secondary | ICD-10-CM | POA: Diagnosis not present

## 2022-06-22 DIAGNOSIS — E119 Type 2 diabetes mellitus without complications: Secondary | ICD-10-CM

## 2022-06-22 DIAGNOSIS — Z7985 Long-term (current) use of injectable non-insulin antidiabetic drugs: Secondary | ICD-10-CM | POA: Insufficient documentation

## 2022-06-22 DIAGNOSIS — E669 Obesity, unspecified: Secondary | ICD-10-CM | POA: Insufficient documentation

## 2022-06-22 DIAGNOSIS — Z6826 Body mass index (BMI) 26.0-26.9, adult: Secondary | ICD-10-CM | POA: Insufficient documentation

## 2022-06-22 HISTORY — PX: SHOULDER ACROMIOPLASTY: SHX6093

## 2022-06-22 HISTORY — PX: LYSIS OF ADHESION: SHX5961

## 2022-06-22 HISTORY — PX: SHOULDER ARTHROSCOPY WITH DISTAL CLAVICLE RESECTION: SHX5675

## 2022-06-22 LAB — GLUCOSE, CAPILLARY
Glucose-Capillary: 103 mg/dL — ABNORMAL HIGH (ref 70–99)
Glucose-Capillary: 98 mg/dL (ref 70–99)

## 2022-06-22 SURGERY — SHOULDER ARTHROSCOPY WITH DISTAL CLAVICLE RESECTION
Anesthesia: Regional | Site: Shoulder | Laterality: Right

## 2022-06-22 MED ORDER — PHENYLEPHRINE HCL-NACL 20-0.9 MG/250ML-% IV SOLN
INTRAVENOUS | Status: DC | PRN
  Administered 2022-06-22: 25 ug/min via INTRAVENOUS

## 2022-06-22 MED ORDER — BUPIVACAINE HCL (PF) 0.5 % IJ SOLN
INTRAMUSCULAR | Status: DC | PRN
  Administered 2022-06-22: 10 mL via PERINEURAL

## 2022-06-22 MED ORDER — FENTANYL CITRATE (PF) 100 MCG/2ML IJ SOLN
100.0000 ug | Freq: Once | INTRAMUSCULAR | Status: AC
  Administered 2022-06-22: 100 ug via INTRAVENOUS

## 2022-06-22 MED ORDER — MIDAZOLAM HCL 2 MG/2ML IJ SOLN
2.0000 mg | Freq: Once | INTRAMUSCULAR | Status: AC
  Administered 2022-06-22: 2 mg via INTRAVENOUS

## 2022-06-22 MED ORDER — FENTANYL CITRATE (PF) 100 MCG/2ML IJ SOLN: 25.0000 ug | INTRAMUSCULAR | Status: DC | PRN

## 2022-06-22 MED ORDER — DEXAMETHASONE SODIUM PHOSPHATE 10 MG/ML IJ SOLN
INTRAMUSCULAR | Status: DC | PRN
  Administered 2022-06-22: 4 mg via INTRAVENOUS

## 2022-06-22 MED ORDER — DEXAMETHASONE SODIUM PHOSPHATE 10 MG/ML IJ SOLN
INTRAMUSCULAR | Status: AC
  Filled 2022-06-22: qty 1

## 2022-06-22 MED ORDER — LACTATED RINGERS IV SOLN: INTRAVENOUS | Status: DC | PRN

## 2022-06-22 MED ORDER — CEFAZOLIN SODIUM-DEXTROSE 2-4 GM/100ML-% IV SOLN
INTRAVENOUS | Status: AC
  Filled 2022-06-22: qty 100

## 2022-06-22 MED ORDER — MEPERIDINE HCL 25 MG/ML IJ SOLN: 6.2500 mg | INTRAMUSCULAR | Status: DC | PRN

## 2022-06-22 MED ORDER — MIDAZOLAM HCL 2 MG/2ML IJ SOLN
INTRAMUSCULAR | Status: AC
  Filled 2022-06-22: qty 2

## 2022-06-22 MED ORDER — ACETAMINOPHEN 325 MG PO TABS: 325.0000 mg | ORAL_TABLET | ORAL | Status: DC | PRN

## 2022-06-22 MED ORDER — LIDOCAINE 2% (20 MG/ML) 5 ML SYRINGE
INTRAMUSCULAR | Status: AC
  Filled 2022-06-22: qty 5

## 2022-06-22 MED ORDER — ACETAMINOPHEN 500 MG PO TABS
ORAL_TABLET | ORAL | Status: AC
  Filled 2022-06-22: qty 2

## 2022-06-22 MED ORDER — ONDANSETRON HCL 4 MG/2ML IJ SOLN
INTRAMUSCULAR | Status: AC
  Filled 2022-06-22: qty 2

## 2022-06-22 MED ORDER — MIDAZOLAM HCL 2 MG/2ML IJ SOLN: 2.0000 mg | Freq: Once | INTRAMUSCULAR | Status: DC

## 2022-06-22 MED ORDER — OXYCODONE HCL 5 MG PO TABS: 5.0000 mg | ORAL_TABLET | Freq: Once | ORAL | Status: DC | PRN

## 2022-06-22 MED ORDER — FENTANYL CITRATE (PF) 100 MCG/2ML IJ SOLN
INTRAMUSCULAR | Status: AC
  Filled 2022-06-22: qty 2

## 2022-06-22 MED ORDER — FENTANYL CITRATE (PF) 100 MCG/2ML IJ SOLN: 100.0000 ug | Freq: Once | INTRAMUSCULAR | Status: DC

## 2022-06-22 MED ORDER — ALBUTEROL SULFATE HFA 108 (90 BASE) MCG/ACT IN AERS
INHALATION_SPRAY | RESPIRATORY_TRACT | Status: AC
  Filled 2022-06-22: qty 6.7

## 2022-06-22 MED ORDER — CEFAZOLIN SODIUM-DEXTROSE 2-4 GM/100ML-% IV SOLN: 2.0000 g | INTRAVENOUS | Status: DC

## 2022-06-22 MED ORDER — SUGAMMADEX SODIUM 200 MG/2ML IV SOLN
INTRAVENOUS | Status: DC | PRN
  Administered 2022-06-22: 200 mg via INTRAVENOUS

## 2022-06-22 MED ORDER — ONDANSETRON HCL 4 MG/2ML IJ SOLN
INTRAMUSCULAR | Status: DC | PRN
  Administered 2022-06-22: 4 mg via INTRAVENOUS

## 2022-06-22 MED ORDER — OXYCODONE HCL 5 MG/5ML PO SOLN: 5.0000 mg | Freq: Once | ORAL | Status: DC | PRN

## 2022-06-22 MED ORDER — PROPOFOL 10 MG/ML IV BOLUS
INTRAVENOUS | Status: DC | PRN
  Administered 2022-06-22: 160 mg via INTRAVENOUS

## 2022-06-22 MED ORDER — ONDANSETRON HCL 4 MG/2ML IJ SOLN: 4.0000 mg | Freq: Once | INTRAMUSCULAR | Status: DC | PRN

## 2022-06-22 MED ORDER — ROCURONIUM BROMIDE 10 MG/ML (PF) SYRINGE
PREFILLED_SYRINGE | INTRAVENOUS | Status: DC | PRN
  Administered 2022-06-22: 60 mg via INTRAVENOUS

## 2022-06-22 MED ORDER — PHENYLEPHRINE HCL (PRESSORS) 10 MG/ML IV SOLN
INTRAVENOUS | Status: AC
  Filled 2022-06-22: qty 1

## 2022-06-22 MED ORDER — ACETAMINOPHEN 160 MG/5ML PO SOLN: 325.0000 mg | ORAL | Status: DC | PRN

## 2022-06-22 MED ORDER — ONDANSETRON HCL 4 MG PO TABS
4.0000 mg | ORAL_TABLET | Freq: Three times a day (TID) | ORAL | 0 refills | Status: DC | PRN
  Filled 2022-06-22: qty 10, 4d supply, fill #0

## 2022-06-22 MED ORDER — HYDROCODONE-ACETAMINOPHEN 10-325 MG PO TABS
1.0000 | ORAL_TABLET | Freq: Four times a day (QID) | ORAL | 0 refills | Status: DC | PRN
  Filled 2022-06-22: qty 20, 5d supply, fill #0

## 2022-06-22 MED ORDER — BUPIVACAINE LIPOSOME 1.3 % IJ SUSP
INTRAMUSCULAR | Status: DC | PRN
  Administered 2022-06-22: 10 mL via PERINEURAL

## 2022-06-22 MED ORDER — ACETAMINOPHEN 500 MG PO TABS
1000.0000 mg | ORAL_TABLET | Freq: Once | ORAL | Status: AC
  Administered 2022-06-22: 1000 mg via ORAL

## 2022-06-22 MED ORDER — SENNA-DOCUSATE SODIUM 8.6-50 MG PO TABS
2.0000 | ORAL_TABLET | Freq: Every day | ORAL | 1 refills | Status: DC
  Filled 2022-06-22: qty 30, 15d supply, fill #0

## 2022-06-22 MED ORDER — LACTATED RINGERS IV SOLN: INTRAVENOUS | Status: DC

## 2022-06-22 MED ORDER — ROCURONIUM BROMIDE 10 MG/ML (PF) SYRINGE
PREFILLED_SYRINGE | INTRAVENOUS | Status: AC
  Filled 2022-06-22: qty 10

## 2022-06-22 MED ORDER — SODIUM CHLORIDE 0.9 % IR SOLN
Status: DC | PRN
  Administered 2022-06-22: 6000 mL

## 2022-06-22 MED ORDER — LIDOCAINE 2% (20 MG/ML) 5 ML SYRINGE
INTRAMUSCULAR | Status: DC | PRN
  Administered 2022-06-22: 100 mg via INTRAVENOUS

## 2022-06-22 MED ORDER — BACLOFEN 10 MG PO TABS
10.0000 mg | ORAL_TABLET | Freq: Three times a day (TID) | ORAL | 0 refills | Status: DC
  Filled 2022-06-22: qty 30, 10d supply, fill #0

## 2022-06-22 SURGICAL SUPPLY — 57 items
AID PSTN UNV HD RSTRNT DISP (MISCELLANEOUS) ×1
BLADE EXCALIBUR 4.0X13 (MISCELLANEOUS) IMPLANT
BLADE SURG 15 STRL LF DISP TIS (BLADE) IMPLANT
BLADE SURG 15 STRL SS (BLADE)
BURR OVAL 8 FLU 4.0X13 (MISCELLANEOUS) IMPLANT
BURR OVAL 8 FLU 5.0X13 (MISCELLANEOUS) IMPLANT
CANNULA 5.75X71 LONG (CANNULA) IMPLANT
CANNULA TWIST IN 8.25X7CM (CANNULA) IMPLANT
CLSR STERI-STRIP ANTIMIC 1/2X4 (GAUZE/BANDAGES/DRESSINGS) ×1 IMPLANT
DISSECTOR  3.8MM X 13CM (MISCELLANEOUS) ×1
DISSECTOR 3.8MM X 13CM (MISCELLANEOUS) ×1 IMPLANT
DRAPE IMP U-DRAPE 54X76 (DRAPES) ×1 IMPLANT
DRAPE INCISE IOBAN 66X45 STRL (DRAPES) ×1 IMPLANT
DRAPE SHOULDER BEACH CHAIR (DRAPES) ×1 IMPLANT
DRAPE SURG 17X23 STRL (DRAPES) IMPLANT
DRAPE U-SHAPE 47X51 STRL (DRAPES) ×1 IMPLANT
DURAPREP 26ML APPLICATOR (WOUND CARE) ×1 IMPLANT
ELECT REM PT RETURN 9FT ADLT (ELECTROSURGICAL)
ELECTRODE REM PT RTRN 9FT ADLT (ELECTROSURGICAL) IMPLANT
FIBERSTICK 2 (SUTURE) IMPLANT
GAUZE PAD ABD 8X10 STRL (GAUZE/BANDAGES/DRESSINGS) ×1 IMPLANT
GAUZE SPONGE 4X4 12PLY STRL (GAUZE/BANDAGES/DRESSINGS) ×1 IMPLANT
GLOVE BIO SURGEON STRL SZ7 (GLOVE) ×1 IMPLANT
GLOVE BIOGEL PI IND STRL 7.0 (GLOVE) ×1 IMPLANT
GLOVE BIOGEL PI IND STRL 8 (GLOVE) ×2 IMPLANT
GLOVE ORTHO TXT STRL SZ7.5 (GLOVE) ×1 IMPLANT
GLOVE SURG SS PI 7.0 STRL IVOR (GLOVE) IMPLANT
GOWN STRL REUS W/ TWL LRG LVL3 (GOWN DISPOSABLE) ×1 IMPLANT
GOWN STRL REUS W/ TWL XL LVL3 (GOWN DISPOSABLE) ×2 IMPLANT
GOWN STRL REUS W/TWL LRG LVL3 (GOWN DISPOSABLE) ×1
GOWN STRL REUS W/TWL XL LVL3 (GOWN DISPOSABLE) ×2
KIT SHOULDER TRACTION (DRAPES) ×1 IMPLANT
LASSO 90 CVE QUICKPAS (DISPOSABLE) IMPLANT
MANIFOLD NEPTUNE II (INSTRUMENTS) ×1 IMPLANT
NDL HD SCORPION MEGA LOADER (NEEDLE) ×1 IMPLANT
PACK ARTHROSCOPY DSU (CUSTOM PROCEDURE TRAY) ×1 IMPLANT
PACK BASIN DAY SURGERY FS (CUSTOM PROCEDURE TRAY) ×1 IMPLANT
PORT APPOLLO RF 90DEGREE MULTI (SURGICAL WAND) ×1 IMPLANT
RESTRAINT HEAD UNIVERSAL NS (MISCELLANEOUS) IMPLANT
SHEET MEDIUM DRAPE 40X70 STRL (DRAPES) ×1 IMPLANT
SLEEVE SCD COMPRESS KNEE MED (STOCKING) ×1 IMPLANT
SLING ARM FOAM STRAP LRG (SOFTGOODS) IMPLANT
SPIKE FLUID TRANSFER (MISCELLANEOUS) IMPLANT
SUPPORT WRAP ARM LG (MISCELLANEOUS) IMPLANT
SUT FIBERWIRE #2 38 T-5 BLUE (SUTURE)
SUT MNCRL AB 4-0 PS2 18 (SUTURE) ×1 IMPLANT
SUT PDS AB 1 CT  36 (SUTURE)
SUT PDS AB 1 CT 36 (SUTURE) IMPLANT
SUT TIGER TAPE 7 IN WHITE (SUTURE) IMPLANT
SUT VIC AB 3-0 SH 27 (SUTURE)
SUT VIC AB 3-0 SH 27X BRD (SUTURE) IMPLANT
SUTURE FIBERWR #2 38 T-5 BLUE (SUTURE) IMPLANT
TAPE FIBER 2MM 7IN #2 BLUE (SUTURE) IMPLANT
TOWEL GREEN STERILE FF (TOWEL DISPOSABLE) ×1 IMPLANT
TUBE CONNECTING 20X1/4 (TUBING) ×1 IMPLANT
TUBING ARTHROSCOPY IRRIG 16FT (MISCELLANEOUS) ×1 IMPLANT
WATER STERILE IRR 1000ML POUR (IV SOLUTION) ×1 IMPLANT

## 2022-06-22 NOTE — Anesthesia Preprocedure Evaluation (Addendum)
Anesthesia Evaluation  Patient identified by MRN, date of birth, ID band Patient awake    Reviewed: Allergy & Precautions, H&P , NPO status , Patient's Chart, lab work & pertinent test results  Airway Mallampati: III  TM Distance: <3 FB Neck ROM: Full   Comment: LOOKS ANTERIOR  Dental no notable dental hx. (+) Teeth Intact, Caps   Pulmonary neg pulmonary ROS   Pulmonary exam normal breath sounds clear to auscultation       Cardiovascular hypertension, Pt. on medications and Pt. on home beta blockers Normal cardiovascular exam Rhythm:Regular Rate:Normal     Neuro/Psych negative neurological ROS  negative psych ROS   GI/Hepatic Neg liver ROS,GERD  Medicated and Controlled,,  Endo/Other  diabetes    Renal/GU negative Renal ROS  negative genitourinary   Musculoskeletal negative musculoskeletal ROS (+)    Abdominal  (+) + obese Abdomen: soft.   Peds  Hematology negative hematology ROS (+)   Anesthesia Other Findings   Reproductive/Obstetrics negative OB ROS Pelvic Pain Menorrhagia                             Anesthesia Physical Anesthesia Plan  ASA: 3  Anesthesia Plan: General and Regional   Post-op Pain Management: Regional block*, Tylenol PO (pre-op)* and Celebrex PO (pre-op)*   Induction: Intravenous  PONV Risk Score and Plan: Ondansetron, Dexamethasone, Treatment may vary due to age or medical condition and Midazolam  Airway Management Planned: Oral ETT  Additional Equipment: None  Intra-op Plan:   Post-operative Plan: Extubation in OR  Informed Consent: I have reviewed the patients History and Physical, chart, labs and discussed the procedure including the risks, benefits and alternatives for the proposed anesthesia with the patient or authorized representative who has indicated his/her understanding and acceptance.     Dental advisory given  Plan Discussed with:  CRNA, Anesthesiologist and Surgeon  Anesthesia Plan Comments:         Anesthesia Quick Evaluation

## 2022-06-22 NOTE — Progress Notes (Signed)
Assisted Dr. Oddono with right, interscalene, ultrasound guided block. Side rails up, monitors on throughout procedure. See vital signs in flow sheet. Tolerated Procedure well. 

## 2022-06-22 NOTE — Op Note (Signed)
06/22/2022  1:46 PM  PATIENT:  Jaime Rhodes    PRE-OPERATIVE DIAGNOSIS: Right shoulder adhesive capsulitis, impingement syndrome, AC joint arthrosis  POST-OPERATIVE DIAGNOSIS:  Same  PROCEDURE: Right shoulder arthroscopy with limited debridement, acromioplasty, distal clavicle resection, with lysis of adhesions  SURGEON:  Eulas Post, MD  PHYSICIAN ASSISTANT: Janine Ores, PA-C, present and scrubbed throughout the case, critical for completion in a timely fashion, and for retraction, instrumentation, and closure.  ANESTHESIA:   General  PREOPERATIVE INDICATIONS:  Jaime Rhodes is a  47 y.o. female with a diagnosis of right shoulder adhesive capsulitis, impingement syndrome, AC joint arthrosis, who failed conservative measures and elected for surgical management.    The risks benefits and alternatives were discussed with the patient preoperatively including but not limited to the risks of infection, bleeding, nerve injury, cardiopulmonary complications, the need for revision surgery, among others, and the patient was willing to proceed.  ESTIMATED BLOOD LOSS: Minimal  OPERATIVE IMPLANTS: None  OPERATIVE FINDINGS: She did have limitations in motion at the extremes of forward flexion and manipulation yielded a significant release.  Glenohumeral articular cartilage was in good condition, although after the manipulation there was an inferior rent of the capsule.  She had some partial undersurface fraying of the supraspinatus which I did debride.  This was more capsular than tenderness.  The bursal layer had thickening of the bursa, although the tendon was intact.  There was significant CA ligament fraying.  The [facility] joint had extensive degenerative changes.  OPERATIVE PROCEDURE: The patient is brought to the operating room and placed in supine position.  General anesthesia was administered.  IV antibiotics were given.  She was placed in the beachchair position.  Timeout performed.  The  right upper extremity was manipulated and yielded significant lysis of adhesions.  The right upper extremity was then prepped and draped in usual sterile fashion and diagnostic arthroscopy carried out with the above named findings.  I used the arthroscopic shaver to debride the undersurface of the supraspinatus, as well as a small area of superior labral fraying.  I went to the rotator interval and resected this using the electrocautery.  I went to the subacromial space, performed a complete bursectomy, moderate acromioplasty, with distal clavicle resection.  I debrided the bursal tissue, as well as the subdeltoid fascia.  Confirmation of appropriate resection of 1 cm of distal clavicle was confirmed on AP visualization from the anterior portal.  The wounds were drained, closed with Monocryl followed by Steri-Strips and sterile gauze.  She was awakened and returned to the PACU in stable and satisfactory condition.  There were no complications and she tolerated the procedure well.

## 2022-06-22 NOTE — Interval H&P Note (Signed)
History and Physical Interval Note:  06/22/2022 11:35 AM  Jaime Rhodes  has presented today for surgery, with the diagnosis of right shoulder cartilage disorder, OA, impingement syndrome,adhesive capsulitis..  The various methods of treatment have been discussed with the patient and family. After consideration of risks, benefits and other options for treatment, the patient has consented to  Procedure(s): SHOULDER ARTHROSCOPY WITH DISTAL CLAVICLE EXCISION (Right) SHOULDER ACROMIOPLASTY (Right) LYSIS OF ADHESION/MANIPULATION (Right) as a surgical intervention.  The patient's history has been reviewed, patient examined, no change in status, stable for surgery.  I have reviewed the patient's chart and labs.  Questions were answered to the patient's satisfaction.     Eulas Post

## 2022-06-22 NOTE — Anesthesia Procedure Notes (Signed)
Anesthesia Regional Block: Interscalene brachial plexus block   Pre-Anesthetic Checklist: , timeout performed,  Correct Patient, Correct Site, Correct Laterality,  Correct Procedure, Correct Position, site marked,  Risks and benefits discussed,  Surgical consent,  Pre-op evaluation,  At surgeon's request and post-op pain management  Laterality: Right  Prep: chloraprep       Needles:  Injection technique: Single-shot  Needle Type: Echogenic Stimulator Needle     Needle Length: 5cm  Needle Gauge: 22     Additional Needles:   Procedures:, nerve stimulator,,, ultrasound used (permanent image in chart),,     Nerve Stimulator or Paresthesia:  Response: hand, 0.45 mA  Additional Responses:   Narrative:  Start time: 06/22/2022 11:30 AM End time: 06/22/2022 11:35 AM Injection made incrementally with aspirations every 5 mL.  Performed by: Personally  Anesthesiologist: Bethena Midget, MD  Additional Notes: Functioning IV was confirmed and monitors were applied.  A 46mm 22ga Arrow echogenic stimulator needle was used. Sterile prep and drape,hand hygiene and sterile gloves were used. Ultrasound guidance: relevant anatomy identified, needle position confirmed, local anesthetic spread visualized around nerve(s)., vascular puncture avoided.  Image printed for medical record. Negative aspiration and negative test dose prior to incremental administration of local anesthetic. The patient tolerated the procedure well.

## 2022-06-22 NOTE — Anesthesia Procedure Notes (Signed)
Procedure Name: Intubation Date/Time: 06/22/2022 12:12 PM  Performed by: Demetrio Lapping, CRNAPre-anesthesia Checklist: Patient identified, Emergency Drugs available, Suction available and Patient being monitored Patient Re-evaluated:Patient Re-evaluated prior to induction Oxygen Delivery Method: Circle System Utilized Preoxygenation: Pre-oxygenation with 100% oxygen Induction Type: IV induction Ventilation: Mask ventilation without difficulty Laryngoscope Size: Mac and 3 Grade View: Grade I Tube type: Oral Number of attempts: 1 (Pt with anterior airway-- Gr 1 view with cric pressure and head lift from MDA) Airway Equipment and Method: Stylet Placement Confirmation: ETT inserted through vocal cords under direct vision, positive ETCO2 and breath sounds checked- equal and bilateral Secured at: 22 cm Tube secured with: Tape Dental Injury: Teeth and Oropharynx as per pre-operative assessment

## 2022-06-22 NOTE — Discharge Instructions (Addendum)
Diet: As you were doing prior to hospitalization   Shower/Dressing:  May shower but keep the wounds dry, use an occlusive plastic wrap, NO SOAKING IN TUB.  If the bandage gets wet, change with a clean dry gauze. You may remove your dressing 3 days after surgery. There are sticky tapes (steri-strips) on your wounds and all the stitches are absorbable.  Leave the steri-strips in place when changing/removing your dressings, they will peel off with time, usually 2-3 weeks.  Activity:  Increase activity slowly as tolerated, but follow the weight bearing instructions below.  The rules on driving is that you can not be taking narcotics while you drive, and you must feel in control of the vehicle.    Weight Bearing:  Ok to weight bear as tolerated. Sling is only for comfort. Ok to unrestricted motion at right shoulder, elbow, wrist, and fingers.   To prevent constipation: you may use a stool softener such as -  Colace (over the counter) 100 mg by mouth twice a day  Drink plenty of fluids (prune juice may be helpful) and high fiber foods Miralax (over the counter) for constipation as needed.    Itching:  If you experience itching with your medications, try taking only a single pain pill, or even half a pain pill at a time.  You may take up to 10 pain pills per day, and you can also use benadryl over the counter for itching or also to help with sleep.   Precautions:  If you experience chest pain or shortness of breath - call 911 immediately for transfer to the hospital emergency department!!  If you develop a fever greater that 101 F, purulent drainage from wound, increased redness or drainage from wound, or calf pain -- Call the office at [phone]                                                Follow- Up Appointment:  Please call for an appointment to be seen in 2 weeks [facility] - [phone]  No Tylenol until after 5:15pm today if needed  Post Anesthesia Home Care  Instructions  Activity: Get plenty of rest for the remainder of the day. A responsible individual must stay with you for 24 hours following the procedure.  For the next 24 hours, DO NOT: -Drive a car -Advertising copywriter -Drink alcoholic beverages -Take any medication unless instructed by your physician -Make any legal decisions or sign important papers.  Meals: Start with liquid foods such as gelatin or soup. Progress to regular foods as tolerated. Avoid greasy, spicy, heavy foods. If nausea and/or vomiting occur, drink only clear liquids until the nausea and/or vomiting subsides. Call your physician if vomiting continues.  Special Instructions/Symptoms: Your throat may feel dry or sore from the anesthesia or the breathing tube placed in your throat during surgery. If this causes discomfort, gargle with warm salt water. The discomfort should disappear within 24 hours.  If you had a scopolamine patch placed behind your ear for the management of post- operative nausea and/or vomiting:  1. The medication in the patch is effective for 72 hours, after which it should be removed.  Wrap patch in a tissue and discard in the trash. Wash hands thoroughly with soap and water. 2. You may remove the patch earlier than 72 hours if you experience unpleasant side effects which  may include dry mouth, dizziness or visual disturbances. 3. Avoid touching the patch. Wash your hands with soap and water after contact with the patch.    Regional Anesthesia Blocks  1. Numbness or the inability to move the "blocked" extremity may last from 3-48 hours after placement. The length of time depends on the medication injected and your individual response to the medication. If the numbness is not going away after 48 hours, call your surgeon.  2. The extremity that is blocked will need to be protected until the numbness is gone and the  Strength has returned. Because you cannot feel it, you will need to take extra care to  avoid injury. Because it may be weak, you may have difficulty moving it or using it. You may not know what position it is in without looking at it while the block is in effect.  3. For blocks in the legs and feet, returning to weight bearing and walking needs to be done carefully. You will need to wait until the numbness is entirely gone and the strength has returned. You should be able to move your leg and foot normally before you try and bear weight or walk. You will need someone to be with you when you first try to ensure you do not fall and possibly risk injury.  4. Bruising and tenderness at the needle site are common side effects and will resolve in a few days.  5. Persistent numbness or new problems with movement should be communicated to the surgeon or the [facility] [phone] [facility] [phone]).

## 2022-06-22 NOTE — Transfer of Care (Signed)
Immediate Anesthesia Transfer of Care Note  Patient: Jaime Rhodes  Procedure(s) Performed: SHOULDER ARTHROSCOPY WITH DISTAL CLAVICLE EXCISION (Right: Shoulder) SHOULDER ACROMIOPLASTY (Right: Shoulder) LYSIS OF ADHESION/MANIPULATION (Right: Shoulder)  Patient Location: PACU  Anesthesia Type:General  Level of Consciousness: awake and patient cooperative  Airway & Oxygen Therapy: Patient Spontanous Breathing and Patient connected to face mask oxygen  Post-op Assessment: Report given to RN and Post -op Vital signs reviewed and stable  Post vital signs: Reviewed and stable  Last Vitals:  Vitals Value Taken Time  BP 134/93 06/22/22 1324  Temp    Pulse 97 06/22/22 1327  Resp 10 06/22/22 1327  SpO2 100 % 06/22/22 1327  Vitals shown include unvalidated device data.  Last Pain:  Vitals:   06/22/22 1111  TempSrc: Oral  PainSc: 0-No pain         Complications: No notable events documented.

## 2022-06-23 ENCOUNTER — Encounter (HOSPITAL_BASED_OUTPATIENT_CLINIC_OR_DEPARTMENT_OTHER): Payer: Self-pay | Admitting: Orthopedic Surgery

## 2022-06-23 NOTE — Anesthesia Postprocedure Evaluation (Signed)
Anesthesia Post Note  Patient: Jaime Rhodes  Procedure(s) Performed: SHOULDER ARTHROSCOPY WITH DISTAL CLAVICLE EXCISION (Right: Shoulder) SHOULDER ACROMIOPLASTY (Right: Shoulder) LYSIS OF ADHESION/MANIPULATION (Right: Shoulder)     Patient location during evaluation: PACU Anesthesia Type: Regional and General Level of consciousness: awake and alert Pain management: pain level controlled Vital Signs Assessment: post-procedure vital signs reviewed and stable Respiratory status: spontaneous breathing, nonlabored ventilation, respiratory function stable and patient connected to nasal cannula oxygen Cardiovascular status: blood pressure returned to baseline and stable Postop Assessment: no apparent nausea or vomiting Anesthetic complications: no   No notable events documented.  Last Vitals:  Vitals:   06/22/22 1400 06/22/22 1412  BP: (!) 132/90 (!) 108/95  Pulse: 88 88  Resp: 13 16  Temp:  (!) 36.2 C  SpO2: 99% 97%    Last Pain:  Vitals:   06/22/22 1412  TempSrc:   PainSc: 2    Pain Goal:                   Jaime Rhodes

## 2022-07-04 NOTE — Progress Notes (Signed)
NEUROLOGY FOLLOW UP OFFICE NOTE  Jaime Rhodes [id number]  Assessment/Plan:   Vestibular migraine Chronic daily headache - may be associated with poor sleep hygiene/sleep schedule or possibly environmental at work (chemical?)     Migraine prevention:  increase topiramate to 75mg  at bedtime.  We can increase to 100mg  at bedtime in 4 weeks if needed. Migraine rescue: Sumatriptan 100mg  as needed.  Take with naproxen 500mg  if wakes up with migraine. Limit use of pain relievers to no more than 2 days out of week to prevent risk of rebound or medication-overuse headache. Keep headache diary Follow up 6 months.     Subjective:  Jaime Rhodes is a 47 year old right-handed female with HTN who follows up for vestibular migraine   UPDATE: Increased topiramate last visit.  Helpful.  Migraines less frequent.  They have been occurring once a week.  Moderate severity.  It takes sumatriptan quickly, it lasts a couple of hours.  If she wakes up with one, it may last a few hours.  Only one migraine since the 9th because she has been out of work following shoulder surgery (has not been exposed to the chemicals and has had more regulated sleep schedule).  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  She had an episode of syncope at work on 05/08/2020.  She was standing at her work station when she bent down to pick something up and when she stood up, she lost consciousness. She fell hitting her head and right shoulder on a dehumidifier.  She reports previous episode of syncope but did not have any warning before she passed out this time.  No convulsions, tongue biting, incontinence or postictal confusion..  She had not eaten since the previous afternoon.  CT head personally reviewed was negative for acute intracranial abnormality.   She was diagnosed in the ED with dehydration.  She saw outpatient cardiology and has an echocardiogram pending.     She was out of work for the first 3 weeks after the concussion.  She now is working on [employer] duty.  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  We tried to get her on a regular day shift, but it was denied.     Since the fall, she has had daily headaches and dizziness.  Headaches are diffuse and bilateral retro-orbital pulsations associated with seeing floaters and nausea.  They are sometimes preceded by feeling lightheaded.  She has a constant dull headache but severe headaches occur once a week and last 1/2 day to 1 day.  Moderate headaches occur 3-4 times a week and last about an hour with Tylenol.  She has history of migraines in her teens-20s.  She gets vertigo/spinning sensation when she has a severe migraine.  However, she has brief vertigo when she turns her head to the left.  Associated symptoms include nausea, diarrhea and sometimes preceded by nose bleed.  Following the fall, she had postconcussion syndrome presenting as cognitive delay and dizziness, which subsequently resolved.  Routine awake and asleep EEG on 08/21/2020 was normal.   Past triptan:  rizatriptan Past antiemetic: Zofran Past antihypertensive:  metoprolol  PAST MEDICAL HISTORY: Past Medical History:  Diagnosis Date   Acne rosacea    GERD (gastroesophageal reflux disease)    treated prn with nexium-will take dos   Herpes  labialis    Hypertension    T2DM (type 2 diabetes mellitus)     MEDICATIONS: Current Outpatient Medications on File Prior to Visit  Medication Sig Dispense Refill   baclofen (LIORESAL) 10 MG tablet Take 1 tablet (10 mg total) by mouth 3 (three) times daily as needed for muscle spasm 30 tablet 0   Blood Glucose Monitoring Suppl (FREESTYLE LITE) w/Device KIT Use as directed 1 kit 0   glucose blood (FREESTYLE LITE) test strip Use to check sugars daily 100 each 3    HYDROcodone-acetaminophen (NORCO) 10-325 MG tablet Take 1 tablet by mouth every 6 (six) hours as needed. 20 tablet 0   Lancets (FREESTYLE) lancets Use to check sugars daily 100 each 3   meloxicam (MOBIC) 15 MG tablet Take 1 tablet (15 mg total) by mouth daily as needed. 30 tablet 0   nystatin-triamcinolone (MYCOLOG II) cream APPLY TO THE AFFECTED AREA(S) BY TOPICAL ROUTE 2 TIMES PER DAY IN THEMORNING AND EVENING 30 g 1   ondansetron (ZOFRAN) 4 MG tablet Take 1 tablet (4 mg total) by mouth every 8 (eight) hours as needed for nausea or vomiting. 10 tablet 0   ondansetron (ZOFRAN-ODT) 4 MG disintegrating tablet Take 1 tablet (4 mg total) by mouth every 8 (eight) hours as needed for nausea or vomiting. 20 tablet 0   pantoprazole (PROTONIX) 40 MG tablet Take 1 tablet (40 mg total) by mouth daily. 90 tablet 0   rosuvastatin (CRESTOR) 10 MG tablet Take 1 tablet (10 mg total) by mouth at bedtime. 90 tablet 2   Semaglutide,0.25 or 0.5MG /DOS, (OZEMPIC, 0.25 OR 0.5 MG/DOSE,) 2 MG/3ML SOPN Inject 0.5 mg into the skin once a week. 9 mL 3   sennosides-docusate sodium (SENOKOT-S) 8.6-50 MG tablet Take 2 tablets by mouth daily. While taking pain medication (for post-operative constipation) 30 tablet 1   SUMAtriptan (IMITREX) 100 MG tablet Take 1 tablet (100 mg total) by mouth as needed for migraine. May repeat in 2 hours if headache persists or recurs.  Maximum 2 tablets in 24 hours. 10 tablet 5   topiramate (TOPAMAX) 50 MG tablet Take 1 tablet (50 mg total) by mouth at bedtime. 30 tablet 5   valACYclovir (VALTREX) 500 MG tablet Take 1 tablet (500 mg total) by mouth every 12 (twelve) hours as needed for fever blisters 60 tablet 0   valsartan (DIOVAN) 80 MG tablet Take 1 tablet (80 mg total) by mouth daily. 90 tablet 3   No current facility-administered medications on file prior to visit.    ALLERGIES: Allergies  Allergen Reactions   Metformin And Related    Semaglutide     Rybelsus   Influenza Virus Vaccine  Rash   Penicillins Rash    FAMILY HISTORY: Family History  Problem Relation Age of Onset   Cancer - Prostate Father    Heart attack Sister    Heart disease Sister    Stroke Sister    Stroke Maternal Grandmother       Objective:  Blood pressure 138/86, pulse 86, height  (1.702 m), weight 156 lb 3.2 oz (70.9 kg), last menstrual period 01/23/2012, SpO2 91 %. General: No acute distress.  Patient appears well-groomed.   Head:  Normocephalic/atraumatic Eyes:  Fundi examined but not visualized Neck: supple, no paraspinal tenderness, full range of motion Heart:  Regular rate and rhythm Neurological Exam: alert and oriented to person, place, and time.  Speech fluent and not dysarthric, language intact.  CN II-XII intact. Bulk and tone normal,  muscle strength 5/5 throughout.  Sensation to light touch intact.  Deep tendon reflexes 2+ throughout.  Finger to nose testing intact.  Gait normal, Romberg negative.   Shon Millet, DO  CC: Cindra Presume, PA-C

## 2022-07-06 ENCOUNTER — Other Ambulatory Visit (HOSPITAL_COMMUNITY): Payer: Self-pay

## 2022-07-06 ENCOUNTER — Encounter: Payer: Self-pay | Admitting: Neurology

## 2022-07-06 ENCOUNTER — Ambulatory Visit (INDEPENDENT_AMBULATORY_CARE_PROVIDER_SITE_OTHER): Payer: 59 | Admitting: Neurology

## 2022-07-06 VITALS — BP 138/86 | HR 86 | Ht 67.0 in | Wt 156.2 lb

## 2022-07-06 DIAGNOSIS — G43809 Other migraine, not intractable, without status migrainosus: Secondary | ICD-10-CM

## 2022-07-06 MED ORDER — NAPROXEN 500 MG PO TABS
500.0000 mg | ORAL_TABLET | Freq: Two times a day (BID) | ORAL | 5 refills | Status: AC | PRN
  Filled 2022-07-06: qty 16, 8d supply, fill #0

## 2022-07-06 MED ORDER — SUMATRIPTAN SUCCINATE 100 MG PO TABS
100.0000 mg | ORAL_TABLET | ORAL | 5 refills | Status: DC | PRN
  Filled 2022-07-06: qty 9, 30d supply, fill #0
  Filled 2022-08-10: qty 9, 30d supply, fill #1
  Filled 2022-09-17: qty 9, 30d supply, fill #2

## 2022-07-06 MED ORDER — TOPIRAMATE 25 MG PO TABS
75.0000 mg | ORAL_TABLET | Freq: Every day | ORAL | 5 refills | Status: DC
  Filled 2022-07-06: qty 90, 30d supply, fill #0
  Filled 2022-09-17: qty 90, 30d supply, fill #1
  Filled 2023-03-11: qty 90, 30d supply, fill #2

## 2022-07-06 NOTE — Patient Instructions (Signed)
Increase topiramate to  at bedtime.  If no improvement in 4 to 6 weeks, contact me When you wake up with migraine, take naproxen  with sumatriptan .  May repeat after 2 hours if needed (maximum 2 tablets in 24 hours) Limit use of pain relievers to no more than 2 days out of week to prevent risk of rebound or medication-overuse headache. Keep headache diary

## 2022-07-12 DIAGNOSIS — M7541 Impingement syndrome of right shoulder: Secondary | ICD-10-CM | POA: Diagnosis not present

## 2022-07-19 DIAGNOSIS — M7541 Impingement syndrome of right shoulder: Secondary | ICD-10-CM | POA: Diagnosis not present

## 2022-07-21 DIAGNOSIS — M7541 Impingement syndrome of right shoulder: Secondary | ICD-10-CM | POA: Diagnosis not present

## 2022-07-28 ENCOUNTER — Other Ambulatory Visit (HOSPITAL_COMMUNITY): Payer: Self-pay

## 2022-07-28 DIAGNOSIS — M7541 Impingement syndrome of right shoulder: Secondary | ICD-10-CM | POA: Diagnosis not present

## 2022-07-28 MED ORDER — MELOXICAM 15 MG PO TABS
15.0000 mg | ORAL_TABLET | Freq: Every day | ORAL | 0 refills | Status: DC
  Filled 2022-07-28: qty 30, 30d supply, fill #0

## 2022-07-28 MED ORDER — MELOXICAM 15 MG PO TABS
15.0000 mg | ORAL_TABLET | Freq: Every day | ORAL | 0 refills | Status: DC | PRN
  Filled 2023-04-07: qty 30, 30d supply, fill #0

## 2022-07-29 DIAGNOSIS — Z01419 Encounter for gynecological examination (general) (routine) without abnormal findings: Secondary | ICD-10-CM | POA: Diagnosis not present

## 2022-07-29 DIAGNOSIS — Z6828 Body mass index (BMI) 28.0-28.9, adult: Secondary | ICD-10-CM | POA: Diagnosis not present

## 2022-07-29 DIAGNOSIS — Z1231 Encounter for screening mammogram for malignant neoplasm of breast: Secondary | ICD-10-CM | POA: Diagnosis not present

## 2022-08-02 ENCOUNTER — Other Ambulatory Visit (HOSPITAL_COMMUNITY): Payer: Self-pay

## 2022-08-02 ENCOUNTER — Other Ambulatory Visit: Payer: Self-pay | Admitting: Obstetrics and Gynecology

## 2022-08-02 DIAGNOSIS — R928 Other abnormal and inconclusive findings on diagnostic imaging of breast: Secondary | ICD-10-CM

## 2022-08-02 MED ORDER — ERYTHROMYCIN 5 MG/GM OP OINT
1.0000 | TOPICAL_OINTMENT | Freq: Two times a day (BID) | OPHTHALMIC | 0 refills | Status: DC
  Filled 2022-08-02: qty 3.5, 7d supply, fill #0

## 2022-08-02 MED ORDER — POLYMYXIN B-TRIMETHOPRIM 10000-0.1 UNIT/ML-% OP SOLN
1.0000 [drp] | Freq: Four times a day (QID) | OPHTHALMIC | 0 refills | Status: DC
  Filled 2022-08-02: qty 10, 10d supply, fill #0

## 2022-08-03 ENCOUNTER — Other Ambulatory Visit (HOSPITAL_COMMUNITY): Payer: Self-pay

## 2022-08-04 ENCOUNTER — Ambulatory Visit
Admission: RE | Admit: 2022-08-04 | Discharge: 2022-08-04 | Disposition: A | Discharge status: Home / Self Care | Payer: 59 | Source: Ambulatory Visit | Attending: Obstetrics and Gynecology | Admitting: Obstetrics and Gynecology

## 2022-08-04 DIAGNOSIS — R92332 Mammographic heterogeneous density, left breast: Secondary | ICD-10-CM | POA: Diagnosis not present

## 2022-08-04 DIAGNOSIS — R921 Mammographic calcification found on diagnostic imaging of breast: Secondary | ICD-10-CM | POA: Diagnosis not present

## 2022-08-04 DIAGNOSIS — R928 Other abnormal and inconclusive findings on diagnostic imaging of breast: Secondary | ICD-10-CM

## 2022-08-10 ENCOUNTER — Other Ambulatory Visit (HOSPITAL_COMMUNITY): Payer: Self-pay

## 2022-08-10 DIAGNOSIS — M7541 Impingement syndrome of right shoulder: Secondary | ICD-10-CM | POA: Diagnosis not present

## 2022-08-10 MED ORDER — ROSUVASTATIN CALCIUM 10 MG PO TABS
10.0000 mg | ORAL_TABLET | Freq: Every day | ORAL | 2 refills | Status: DC
  Filled 2022-08-10: qty 90, 90d supply, fill #0
  Filled 2022-11-19: qty 90, 90d supply, fill #1
  Filled 2023-04-07: qty 90, 90d supply, fill #2

## 2022-08-10 MED ORDER — PANTOPRAZOLE SODIUM 40 MG PO TBEC
40.0000 mg | DELAYED_RELEASE_TABLET | Freq: Every day | ORAL | 2 refills | Status: DC
  Filled 2022-08-10: qty 90, 90d supply, fill #0
  Filled 2022-11-19: qty 90, 90d supply, fill #1
  Filled 2023-04-07: qty 90, 90d supply, fill #2

## 2022-08-12 ENCOUNTER — Other Ambulatory Visit (HOSPITAL_COMMUNITY): Payer: Self-pay

## 2022-08-12 DIAGNOSIS — M7541 Impingement syndrome of right shoulder: Secondary | ICD-10-CM | POA: Diagnosis not present

## 2022-08-12 DIAGNOSIS — N83201 Unspecified ovarian cyst, right side: Secondary | ICD-10-CM | POA: Diagnosis not present

## 2022-08-12 DIAGNOSIS — N83202 Unspecified ovarian cyst, left side: Secondary | ICD-10-CM | POA: Diagnosis not present

## 2022-08-12 DIAGNOSIS — N83209 Unspecified ovarian cyst, unspecified side: Secondary | ICD-10-CM | POA: Diagnosis not present

## 2022-08-17 ENCOUNTER — Other Ambulatory Visit (HOSPITAL_COMMUNITY): Payer: Self-pay

## 2022-08-24 DIAGNOSIS — M7541 Impingement syndrome of right shoulder: Secondary | ICD-10-CM | POA: Diagnosis not present

## 2022-09-15 ENCOUNTER — Other Ambulatory Visit (HOSPITAL_COMMUNITY): Payer: Self-pay

## 2022-09-17 ENCOUNTER — Other Ambulatory Visit (HOSPITAL_COMMUNITY): Payer: Self-pay

## 2022-10-18 ENCOUNTER — Other Ambulatory Visit (HOSPITAL_COMMUNITY): Payer: Self-pay

## 2022-10-18 MED ORDER — CHLORHEXIDINE GLUCONATE 0.12 % MT SOLN
OROMUCOSAL | 3 refills | Status: DC
  Filled 2022-10-18: qty 473, 11d supply, fill #0

## 2022-11-19 ENCOUNTER — Other Ambulatory Visit (HOSPITAL_COMMUNITY): Payer: Self-pay

## 2022-11-19 MED ORDER — MELOXICAM 15 MG PO TABS
15.0000 mg | ORAL_TABLET | Freq: Every day | ORAL | 0 refills | Status: DC | PRN
  Filled 2022-11-19: qty 30, 30d supply, fill #0

## 2022-11-19 MED ORDER — VALACYCLOVIR HCL 500 MG PO TABS
ORAL_TABLET | ORAL | 0 refills | Status: AC
  Filled 2022-11-19: qty 60, 30d supply, fill #0

## 2022-11-22 ENCOUNTER — Other Ambulatory Visit (HOSPITAL_COMMUNITY): Payer: Self-pay

## 2022-11-22 DIAGNOSIS — M7542 Impingement syndrome of left shoulder: Secondary | ICD-10-CM | POA: Diagnosis not present

## 2022-11-24 ENCOUNTER — Other Ambulatory Visit (HOSPITAL_COMMUNITY): Payer: Self-pay

## 2022-11-24 DIAGNOSIS — M2550 Pain in unspecified joint: Secondary | ICD-10-CM | POA: Diagnosis not present

## 2022-11-24 DIAGNOSIS — G43109 Migraine with aura, not intractable, without status migrainosus: Secondary | ICD-10-CM | POA: Diagnosis not present

## 2022-11-24 DIAGNOSIS — E785 Hyperlipidemia, unspecified: Secondary | ICD-10-CM | POA: Diagnosis not present

## 2022-11-24 DIAGNOSIS — E1169 Type 2 diabetes mellitus with other specified complication: Secondary | ICD-10-CM | POA: Diagnosis not present

## 2022-11-24 DIAGNOSIS — I10 Essential (primary) hypertension: Secondary | ICD-10-CM | POA: Diagnosis not present

## 2022-11-24 DIAGNOSIS — B001 Herpesviral vesicular dermatitis: Secondary | ICD-10-CM | POA: Diagnosis not present

## 2022-11-24 DIAGNOSIS — K219 Gastro-esophageal reflux disease without esophagitis: Secondary | ICD-10-CM | POA: Diagnosis not present

## 2022-11-24 MED ORDER — OZEMPIC (1 MG/DOSE) 4 MG/3ML ~~LOC~~ SOPN
1.0000 mg | PEN_INJECTOR | SUBCUTANEOUS | 0 refills | Status: DC
  Filled 2022-11-24 – 2022-11-26 (×2): qty 3, 28d supply, fill #0
  Filled 2022-11-26 – 2023-05-23 (×2): qty 9, 84d supply, fill #0

## 2022-11-26 ENCOUNTER — Encounter (HOSPITAL_COMMUNITY): Payer: Self-pay

## 2022-11-26 ENCOUNTER — Other Ambulatory Visit (HOSPITAL_COMMUNITY): Payer: Self-pay

## 2022-11-26 MED ORDER — OZEMPIC (1 MG/DOSE) 4 MG/3ML ~~LOC~~ SOPN
1.0000 mg | PEN_INJECTOR | SUBCUTANEOUS | 0 refills | Status: AC
  Filled 2022-11-26: qty 3, 28d supply, fill #0
  Filled 2022-12-21 – 2023-03-15 (×3): qty 9, 84d supply, fill #0

## 2022-11-30 ENCOUNTER — Other Ambulatory Visit (HOSPITAL_COMMUNITY): Payer: Self-pay

## 2022-12-01 ENCOUNTER — Other Ambulatory Visit (HOSPITAL_COMMUNITY): Payer: Self-pay

## 2022-12-21 ENCOUNTER — Other Ambulatory Visit: Payer: Self-pay

## 2023-01-12 NOTE — Progress Notes (Unsigned)
NEUROLOGY FOLLOW UP OFFICE NOTE  Jaime Rhodes [id number]  Assessment/Plan:   Vestibular migraine Headaches Likely triggered by chemical exposure at her job.  She is no longer at that job and doing well     Migraine prevention:  Not indicated at this time. Headache rescue: Excedrin Follow up in 6 months to make sure she is still doing well     Subjective:  Jaime Rhodes is a 47 year old right-handed female with HTN who follows up for vestibular migraine   UPDATE: In April, she had shoulder surgery and had to stay out of work.  Once she no longer was at work, migraines improved.  She tapered off of topiramate and stopped sumatriptan to see if she still needed them.  She has remained out of work.  For the past 3 months, she has not had a migraine.  She has had only 2 mild headaches that respond to Excedrin in 30 minutes.  She has thus decided not to return to work.    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  She had an episode of syncope at work on 05/08/2020.  She was standing at her work station when she bent down to pick something up and when she stood up, she lost consciousness. She fell hitting her head and right shoulder on a dehumidifier.  She reports previous episode of syncope but did not have any warning before she passed out this time.  No convulsions, tongue biting, incontinence or postictal confusion..  She had not eaten since the previous afternoon.  CT head personally reviewed was negative for acute intracranial abnormality.  She was diagnosed in the ED with dehydration.  She saw outpatient cardiology and has an echocardiogram pending.     She was out of work for the first 3 weeks after the concussion.  She now is working on [employer] duty.  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  We tried to get her on a regular day shift, but it was denied.     Since the fall, she has had daily headaches and  dizziness.  Headaches are diffuse and bilateral retro-orbital pulsations associated with seeing floaters and nausea.  They are sometimes preceded by feeling lightheaded.  She has a constant dull headache but severe headaches occur once a week and last 1/2 day to 1 day.  Moderate headaches occur 3-4 times a week and last about an hour with Tylenol.  She has history of migraines in her teens-20s.  She gets vertigo/spinning sensation when she has a severe migraine.  However, she has brief vertigo when she turns her head to the left.  Associated symptoms include nausea, diarrhea and sometimes preceded by nose bleed.  Following the fall, she had postconcussion syndrome presenting as cognitive delay and dizziness, which subsequently resolved.  Routine awake and asleep EEG on 08/21/2020 was normal.   Past NSAIDs/analgesics:  Naproxen, ibuprofen Past triptan:  rizatriptan, sumatriptan tab Past antiemetic: Zofran Past antihypertensive:  metoprolol Past antiepileptic:  topiramate 75mg  at bedtime (effective)  PAST MEDICAL HISTORY: Past Medical History:  Diagnosis Date   Acne rosacea    GERD (gastroesophageal reflux disease)    treated prn with nexium-will take dos   Herpes labialis    Hypertension    T2DM (type 2 diabetes mellitus) (HCC)     MEDICATIONS: Current Outpatient Medications on File Prior to Visit  Medication Sig Dispense Refill  Blood Glucose Monitoring Suppl (FREESTYLE LITE) w/Device KIT Use as directed 1 kit 0   chlorhexidine (PERIDEX) 0.12 % solution Rinse with one capful and expectorate 2-3 times per day 473 mL 3   erythromycin ophthalmic ointment Place 1 Application into the lower eyelid of affected eye 2 (two) times daily for 7 days. 3.5 g 0   glucose blood (FREESTYLE LITE) test strip Use to check sugars daily 100 each 3   Lancets (FREESTYLE) lancets Use to check sugars daily 100 each 3   meloxicam (MOBIC) 15 MG tablet Take 1 tablet (15 mg total) by mouth daily as needed. 30 tablet 0    meloxicam (MOBIC) 15 MG tablet Take 1 tablet (15 mg total) by mouth daily as needed. 30 tablet 0   meloxicam (MOBIC) 15 MG tablet Take 1 tablet (15 mg total) by mouth daily as needed. 30 tablet 0   naproxen (NAPROSYN) 500 MG tablet Take 1 tablet (500 mg total) by mouth every 12 (twelve) hours as needed. 16 tablet 5   ondansetron (ZOFRAN) 4 MG tablet Take 1 tablet (4 mg total) by mouth every 8 (eight) hours as needed for nausea or vomiting. 10 tablet 0   ondansetron (ZOFRAN-ODT) 4 MG disintegrating tablet Take 1 tablet (4 mg total) by mouth every 8 (eight) hours as needed for nausea or vomiting. 20 tablet 0   pantoprazole (PROTONIX) 40 MG tablet Take 1 tablet (40 mg total) by mouth daily. 90 tablet 2   rosuvastatin (CRESTOR) 10 MG tablet Take 1 tablet (10 mg total) by mouth at bedtime. 90 tablet 2   Semaglutide, 1 MG/DOSE, (OZEMPIC, 1 MG/DOSE,) 4 MG/3ML SOPN Inject 1 mg into the skin once a week as directed 9 mL 0   Semaglutide, 1 MG/DOSE, (OZEMPIC, 1 MG/DOSE,) 4 MG/3ML SOPN Inject 1 mg into the skin once a week. 9 mL 0   Semaglutide,0.25 or 0.5MG /DOS, (OZEMPIC, 0.25 OR 0.5 MG/DOSE,) 2 MG/3ML SOPN Inject 0.5 mg into the skin once a week. 9 mL 3   SUMAtriptan (IMITREX) 100 MG tablet Take 1 tablet (100 mg total) by mouth as needed for migraine. May repeat in 2 hours if headache persists or recurs.  Maximum 2 tablets in 24 hours. 10 tablet 5   topiramate (TOPAMAX) 25 MG tablet Take 3 tablets (75 mg total) by mouth at bedtime. 90 tablet 5   trimethoprim-polymyxin b (POLYTRIM) ophthalmic solution Place 1 drop into affected eye 4 (four) times daily for 5 days 10 mL 0   valACYclovir (VALTREX) 500 MG tablet Take 1 tablet (500 mg total) by mouth every 12 (twelve) hours as needed for fever blisters 60 tablet 0   valsartan (DIOVAN) 80 MG tablet Take 1 tablet (80 mg total) by mouth daily. 90 tablet 3   No current facility-administered medications on file prior to visit.    ALLERGIES: Allergies  Allergen  Reactions   Metformin And Related    Semaglutide     Rybelsus   Influenza Virus Vaccine Rash   Penicillins Rash    FAMILY HISTORY: Family History  Problem Relation Age of Onset   Cancer - Prostate Father    Heart attack Sister    Heart disease Sister    Stroke Sister    Stroke Maternal Grandmother       Objective:  Blood pressure 132/89, pulse 97, resp. rate 20, height 5\' 7"  (1.702 m), weight 187 lb (84.8 kg), last menstrual period 01/23/2012. General: No acute distress.  Patient appears well-groomed.  Shon Millet, DO  CC: Cindra Presume, PA-C

## 2023-01-13 ENCOUNTER — Encounter: Payer: Self-pay | Admitting: Neurology

## 2023-01-13 ENCOUNTER — Ambulatory Visit (INDEPENDENT_AMBULATORY_CARE_PROVIDER_SITE_OTHER): Payer: 59 | Admitting: Neurology

## 2023-01-13 VITALS — BP 132/89 | HR 97 | Resp 20 | Ht 67.0 in | Wt 187.0 lb

## 2023-01-13 DIAGNOSIS — G43809 Other migraine, not intractable, without status migrainosus: Secondary | ICD-10-CM

## 2023-03-11 ENCOUNTER — Other Ambulatory Visit (HOSPITAL_COMMUNITY): Payer: Self-pay

## 2023-03-15 ENCOUNTER — Other Ambulatory Visit (HOSPITAL_COMMUNITY): Payer: Self-pay

## 2023-04-07 ENCOUNTER — Encounter: Payer: Self-pay | Admitting: Pharmacist

## 2023-04-07 ENCOUNTER — Other Ambulatory Visit: Payer: Self-pay

## 2023-04-07 ENCOUNTER — Other Ambulatory Visit (HOSPITAL_COMMUNITY): Payer: Self-pay

## 2023-04-07 ENCOUNTER — Encounter: Payer: Self-pay | Admitting: Neurology

## 2023-04-08 ENCOUNTER — Other Ambulatory Visit (HOSPITAL_COMMUNITY): Payer: Self-pay

## 2023-04-08 MED ORDER — FREESTYLE LANCETS MISC
0 refills | Status: AC
  Filled 2023-04-08 – 2023-06-09 (×2): qty 100, 90d supply, fill #0
  Filled 2023-06-09: qty 100, 100d supply, fill #0

## 2023-04-08 MED ORDER — FREESTYLE LITE TEST VI STRP
ORAL_STRIP | 0 refills | Status: AC
  Filled 2023-04-08: qty 100, 100d supply, fill #0
  Filled 2023-06-09: qty 100, 90d supply, fill #0
  Filled 2023-06-09: qty 100, 100d supply, fill #0

## 2023-04-15 ENCOUNTER — Other Ambulatory Visit (HOSPITAL_COMMUNITY): Payer: Self-pay

## 2023-04-27 ENCOUNTER — Other Ambulatory Visit (HOSPITAL_COMMUNITY): Payer: Self-pay

## 2023-04-27 DIAGNOSIS — M7542 Impingement syndrome of left shoulder: Secondary | ICD-10-CM | POA: Diagnosis not present

## 2023-04-27 DIAGNOSIS — M7502 Adhesive capsulitis of left shoulder: Secondary | ICD-10-CM | POA: Diagnosis not present

## 2023-04-27 MED ORDER — MELOXICAM 15 MG PO TABS
ORAL_TABLET | ORAL | 1 refills | Status: DC
  Filled 2023-04-27 – 2023-05-23 (×2): qty 30, 30d supply, fill #0
  Filled 2023-09-26: qty 30, 30d supply, fill #1

## 2023-05-23 ENCOUNTER — Other Ambulatory Visit (HOSPITAL_COMMUNITY): Payer: Self-pay

## 2023-05-23 DIAGNOSIS — M7502 Adhesive capsulitis of left shoulder: Secondary | ICD-10-CM | POA: Diagnosis not present

## 2023-05-30 DIAGNOSIS — M25512 Pain in left shoulder: Secondary | ICD-10-CM | POA: Diagnosis not present

## 2023-06-06 DIAGNOSIS — M7502 Adhesive capsulitis of left shoulder: Secondary | ICD-10-CM | POA: Diagnosis not present

## 2023-06-07 ENCOUNTER — Other Ambulatory Visit (HOSPITAL_COMMUNITY): Payer: Self-pay

## 2023-06-08 DIAGNOSIS — E1169 Type 2 diabetes mellitus with other specified complication: Secondary | ICD-10-CM | POA: Diagnosis not present

## 2023-06-08 DIAGNOSIS — K219 Gastro-esophageal reflux disease without esophagitis: Secondary | ICD-10-CM | POA: Diagnosis not present

## 2023-06-08 DIAGNOSIS — E785 Hyperlipidemia, unspecified: Secondary | ICD-10-CM | POA: Diagnosis not present

## 2023-06-08 DIAGNOSIS — Z0000 Encounter for general adult medical examination without abnormal findings: Secondary | ICD-10-CM | POA: Diagnosis not present

## 2023-06-08 DIAGNOSIS — I10 Essential (primary) hypertension: Secondary | ICD-10-CM | POA: Diagnosis not present

## 2023-06-09 ENCOUNTER — Other Ambulatory Visit: Payer: Self-pay

## 2023-06-09 ENCOUNTER — Other Ambulatory Visit (HOSPITAL_COMMUNITY): Payer: Self-pay

## 2023-06-09 MED ORDER — HYDROCHLOROTHIAZIDE 12.5 MG PO TABS
12.5000 mg | ORAL_TABLET | Freq: Every day | ORAL | 5 refills | Status: AC | PRN
  Filled 2023-06-09: qty 30, 30d supply, fill #0
  Filled 2023-07-25: qty 30, 30d supply, fill #1
  Filled 2023-09-26: qty 30, 30d supply, fill #2
  Filled 2024-04-06: qty 30, 30d supply, fill #3

## 2023-06-18 ENCOUNTER — Other Ambulatory Visit (HOSPITAL_COMMUNITY): Payer: Self-pay

## 2023-07-15 ENCOUNTER — Ambulatory Visit: Payer: 59 | Admitting: Neurology

## 2023-07-25 ENCOUNTER — Other Ambulatory Visit (HOSPITAL_COMMUNITY): Payer: Self-pay

## 2023-07-26 ENCOUNTER — Other Ambulatory Visit (HOSPITAL_COMMUNITY): Payer: Self-pay

## 2023-08-15 DIAGNOSIS — E785 Hyperlipidemia, unspecified: Secondary | ICD-10-CM | POA: Diagnosis not present

## 2023-08-17 NOTE — Progress Notes (Unsigned)
 NEUROLOGY FOLLOW UP OFFICE NOTE  Jaime Rhodes [id number]  Assessment/Plan:   Vestibular migraine Headaches Likely triggered by chemical exposure at her job.  She is no longer at that job and doing well     Migraine prevention:  Not indicated at this time. 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  She had an episode of syncope at work on 05/08/2020.  She was standing at her work station when she bent down to pick something up and when she stood up, she lost consciousness. She fell hitting her head and right shoulder on a dehumidifier.  She reports previous episode of syncope but did not have any warning before she passed out this time.  No convulsions, tongue biting, incontinence or postictal confusion..  She had not eaten since the previous afternoon.  CT head personally reviewed was negative for acute intracranial abnormality.  She was diagnosed in the ED with dehydration.  She saw outpatient cardiology and has an echocardiogram pending.     She was out of work for the first 3 weeks after the concussion.  She now is working on [employer] duty.  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  We tried to get her on a regular day shift, but it was denied.     Since the fall, she has had daily headaches and dizziness.  Headaches are diffuse and bilateral retro-orbital pulsations associated with seeing floaters and nausea.  They are sometimes preceded by feeling lightheaded.  She has a constant dull headache but severe headaches occur once a week and last 1/2 day to 1 day.  Moderate headaches occur 3-4 times a week and last about an hour with Tylenol .  She has history of migraines in her teens-20s.  She gets  vertigo/spinning sensation when she has a severe migraine.  However, she has brief vertigo when she turns her head to the left.  Associated symptoms include nausea, diarrhea and sometimes preceded by nose bleed.  Following the fall, she had postconcussion syndrome presenting as cognitive delay and dizziness, which subsequently resolved.  Routine awake and asleep EEG on 08/21/2020 was normal.   Past NSAIDs/analgesics:  Naproxen , ibuprofen  Past triptan:  rizatriptan , sumatriptan  tab Past antiemetic: Zofran  Past antihypertensive:  metoprolol  Past antiepileptic:  topiramate  75mg  at bedtime (effective)  PAST MEDICAL HISTORY: Past Medical History:  Diagnosis Date   Acne rosacea    GERD (gastroesophageal reflux disease)    treated prn with nexium-will take dos   Herpes labialis    Hypertension    T2DM (type 2 diabetes mellitus) (HCC)     MEDICATIONS: Current Outpatient Medications on File Prior to Visit  Medication Sig Dispense Refill   Blood Glucose Monitoring Suppl (FREESTYLE LITE) w/Device KIT Use as directed (Patient not taking: Reported on 01/13/2023) 1 kit 0   chlorhexidine  (PERIDEX ) 0.12 % solution Rinse with one capful and expectorate 2-3 times per day (Patient not taking: Reported on 01/13/2023) 473 mL 3   erythromycin  ophthalmic ointment Place 1 Application into the lower eyelid of affected eye 2 (two) times daily for 7 days. (Patient not taking: Reported on 01/13/2023) 3.5 g 0   glucose blood (  FREESTYLE LITE) test strip Use to check sugars once daily as directed. 100 each 0   hydrochlorothiazide  (HYDRODIURIL ) 12.5 MG tablet Take 1 tablet (12.5 mg total) by mouth daily as needed. 30 tablet 5   Lancets (FREESTYLE) lancets Use to check sugars once daily as directed. 100 each 0   meloxicam  (MOBIC ) 15 MG tablet Take 1 tablet (15 mg total) by mouth daily as needed. (Patient not taking: Reported on 01/13/2023) 30 tablet 0   meloxicam  (MOBIC ) 15 MG tablet Take 1 tablet (15 mg total) by mouth  daily as needed. (Patient not taking: Reported on 01/13/2023) 30 tablet 0   meloxicam  (MOBIC ) 15 MG tablet Take 1 tablet (15 mg total) by mouth daily as needed. (Patient not taking: Reported on 01/13/2023) 30 tablet 0   meloxicam  (MOBIC ) 15 MG tablet Take 1 tablet by mouth once a day with meals 30 tablet 1   naproxen  (NAPROSYN ) 500 MG tablet Take 1 tablet (500 mg total) by mouth every 12 (twelve) hours as needed. (Patient not taking: Reported on 01/13/2023) 16 tablet 5   ondansetron  (ZOFRAN ) 4 MG tablet Take 1 tablet (4 mg total) by mouth every 8 (eight) hours as needed for nausea or vomiting. (Patient not taking: Reported on 01/13/2023) 10 tablet 0   ondansetron  (ZOFRAN -ODT) 4 MG disintegrating tablet Take 1 tablet (4 mg total) by mouth every 8 (eight) hours as needed for nausea or vomiting. (Patient not taking: Reported on 01/13/2023) 20 tablet 0   pantoprazole  (PROTONIX ) 40 MG tablet Take 1 tablet (40 mg total) by mouth daily. (Patient not taking: Reported on 01/13/2023) 90 tablet 2   rosuvastatin  (CRESTOR ) 10 MG tablet Take 1 tablet (10 mg total) by mouth at bedtime. 90 tablet 2   Semaglutide , 1 MG/DOSE, (OZEMPIC , 1 MG/DOSE,) 4 MG/3ML SOPN Inject 1 mg into the skin once a week as directed 9 mL 0   Semaglutide , 1 MG/DOSE, (OZEMPIC , 1 MG/DOSE,) 4 MG/3ML SOPN Inject 1 mg into the skin once a week, as directed. 9 mL 0   Semaglutide ,0.25 or 0.5MG /DOS, (OZEMPIC , 0.25 OR 0.5 MG/DOSE,) 2 MG/3ML SOPN Inject 0.5 mg into the skin once a week. 9 mL 3   SUMAtriptan  (IMITREX ) 100 MG tablet Take 1 tablet (100 mg total) by mouth as needed for migraine. May repeat in 2 hours if headache persists or recurs.  Maximum 2 tablets in 24 hours. (Patient not taking: Reported on 01/13/2023) 10 tablet 5   topiramate  (TOPAMAX ) 25 MG tablet Take 3 tablets (75 mg total) by mouth at bedtime. (Patient not taking: Reported on 01/13/2023) 90 tablet 5   trimethoprim -polymyxin b  (POLYTRIM ) ophthalmic solution Place 1 drop into  affected eye 4 (four) times daily for 5 days (Patient not taking: Reported on 01/13/2023) 10 mL 0   valACYclovir  (VALTREX ) 500 MG tablet Take 1 tablet (500 mg total) by mouth every 12 (twelve) hours as needed for fever blisters (Patient not taking: Reported on 01/13/2023) 60 tablet 0   valsartan  (DIOVAN ) 80 MG tablet Take 1 tablet (80 mg total) by mouth daily. 90 tablet 3   No current facility-administered medications on file prior to visit.    ALLERGIES: Allergies  Allergen Reactions   Metformin  And Related    Semaglutide      Rybelsus    Influenza Virus Vaccine Rash   Penicillins Rash    FAMILY HISTORY: Family History  Problem Relation Age of Onset   Cancer - Prostate Father    Heart attack Sister    Heart disease Sister  Stroke Sister    Stroke Maternal Grandmother       Objective:  *** General: No acute distress.  Patient appears well-groomed.   Head:  Normocephalic/atraumatic Neck:  Supple.  No paraspinal tenderness.  Full range of motion. Heart:  Regular rate and rhythm. Neuro:  Alert and oriented.  Speech fluent and not dysarthric.  Language intact.  CN II-XII intact.  Bulk and tone normal.  Muscle strength 5/5 throughout.  Sensation to light touch intact.  Deep tendon reflexes 2+ throughout, toes downgoing.  Gait normal.  Romberg negative.    Janne Members, DO  CC: Morton Areas, PA-C

## 2023-08-18 ENCOUNTER — Encounter: Payer: Self-pay | Admitting: Neurology

## 2023-08-18 ENCOUNTER — Ambulatory Visit: Payer: 59 | Admitting: Neurology

## 2023-08-18 VITALS — BP 121/81 | HR 92 | Ht 67.0 in | Wt 187.0 lb

## 2023-08-18 DIAGNOSIS — G43809 Other migraine, not intractable, without status migrainosus: Secondary | ICD-10-CM | POA: Diagnosis not present

## 2023-08-18 DIAGNOSIS — G43009 Migraine without aura, not intractable, without status migrainosus: Secondary | ICD-10-CM | POA: Diagnosis not present

## 2023-08-18 NOTE — Patient Instructions (Addendum)
.   Try MigreLief and CoQ10 300mg  daily Excedrin migraine first line, sumatriptan  second line Routine exercise Limit use of pain relievers to no more than 9 days out of the month to prevent risk of rebound or medication-overuse headache. Keep headache diary

## 2023-08-23 ENCOUNTER — Encounter (HOSPITAL_COMMUNITY): Payer: Self-pay

## 2023-08-23 ENCOUNTER — Other Ambulatory Visit (HOSPITAL_COMMUNITY): Payer: Self-pay

## 2023-08-23 MED ORDER — OZEMPIC (2 MG/DOSE) 8 MG/3ML ~~LOC~~ SOPN
2.0000 mg | PEN_INJECTOR | SUBCUTANEOUS | 5 refills | Status: DC
  Filled 2023-08-23: qty 3, 28d supply, fill #0
  Filled 2023-09-26: qty 3, 28d supply, fill #1
  Filled 2023-10-18: qty 3, 28d supply, fill #2
  Filled 2023-10-20: qty 3, 28d supply, fill #0
  Filled 2023-11-17: qty 3, 28d supply, fill #1
  Filled 2023-12-15: qty 3, 28d supply, fill #2
  Filled 2024-01-10: qty 3, 28d supply, fill #3

## 2023-08-29 DIAGNOSIS — Z1231 Encounter for screening mammogram for malignant neoplasm of breast: Secondary | ICD-10-CM | POA: Diagnosis not present

## 2023-08-29 DIAGNOSIS — Z01419 Encounter for gynecological examination (general) (routine) without abnormal findings: Secondary | ICD-10-CM | POA: Diagnosis not present

## 2023-08-29 DIAGNOSIS — Z6829 Body mass index (BMI) 29.0-29.9, adult: Secondary | ICD-10-CM | POA: Diagnosis not present

## 2023-09-26 ENCOUNTER — Other Ambulatory Visit (HOSPITAL_COMMUNITY): Payer: Self-pay

## 2023-09-27 ENCOUNTER — Other Ambulatory Visit (HOSPITAL_COMMUNITY): Payer: Self-pay

## 2023-09-27 ENCOUNTER — Other Ambulatory Visit: Payer: Self-pay

## 2023-09-27 MED ORDER — ROSUVASTATIN CALCIUM 10 MG PO TABS
10.0000 mg | ORAL_TABLET | Freq: Every day | ORAL | 0 refills | Status: DC
  Filled 2023-09-27: qty 90, 90d supply, fill #0

## 2023-09-27 MED ORDER — PANTOPRAZOLE SODIUM 40 MG PO TBEC
40.0000 mg | DELAYED_RELEASE_TABLET | Freq: Every day | ORAL | 0 refills | Status: DC
  Filled 2023-09-27: qty 90, 90d supply, fill #0

## 2023-09-28 ENCOUNTER — Other Ambulatory Visit: Payer: Self-pay

## 2023-09-28 ENCOUNTER — Other Ambulatory Visit (HOSPITAL_COMMUNITY): Payer: Self-pay

## 2023-09-28 MED ORDER — VALSARTAN 80 MG PO TABS
80.0000 mg | ORAL_TABLET | Freq: Every day | ORAL | 0 refills | Status: DC
  Filled 2023-09-28: qty 90, 90d supply, fill #0

## 2023-10-18 ENCOUNTER — Other Ambulatory Visit (HOSPITAL_COMMUNITY): Payer: Self-pay

## 2023-10-20 ENCOUNTER — Other Ambulatory Visit (HOSPITAL_COMMUNITY): Payer: Self-pay

## 2023-10-20 ENCOUNTER — Other Ambulatory Visit: Payer: Self-pay

## 2023-11-18 ENCOUNTER — Other Ambulatory Visit (HOSPITAL_COMMUNITY): Payer: Self-pay

## 2023-11-18 ENCOUNTER — Other Ambulatory Visit: Payer: Self-pay

## 2023-12-06 DIAGNOSIS — E1169 Type 2 diabetes mellitus with other specified complication: Secondary | ICD-10-CM | POA: Diagnosis not present

## 2023-12-06 DIAGNOSIS — R233 Spontaneous ecchymoses: Secondary | ICD-10-CM | POA: Diagnosis not present

## 2023-12-06 DIAGNOSIS — I10 Essential (primary) hypertension: Secondary | ICD-10-CM | POA: Diagnosis not present

## 2023-12-06 DIAGNOSIS — K219 Gastro-esophageal reflux disease without esophagitis: Secondary | ICD-10-CM | POA: Diagnosis not present

## 2023-12-06 DIAGNOSIS — E785 Hyperlipidemia, unspecified: Secondary | ICD-10-CM | POA: Diagnosis not present

## 2023-12-15 ENCOUNTER — Other Ambulatory Visit (HOSPITAL_COMMUNITY): Payer: Self-pay

## 2024-01-02 ENCOUNTER — Other Ambulatory Visit (HOSPITAL_COMMUNITY): Payer: Self-pay

## 2024-01-03 ENCOUNTER — Other Ambulatory Visit: Payer: Self-pay

## 2024-01-03 ENCOUNTER — Other Ambulatory Visit (HOSPITAL_COMMUNITY): Payer: Self-pay

## 2024-01-03 MED ORDER — PANTOPRAZOLE SODIUM 40 MG PO TBEC
40.0000 mg | DELAYED_RELEASE_TABLET | Freq: Every day | ORAL | 1 refills | Status: AC
  Filled 2024-01-03 – 2024-01-11 (×2): qty 90, 90d supply, fill #0
  Filled 2024-04-06: qty 90, 90d supply, fill #1

## 2024-01-03 MED ORDER — VALACYCLOVIR HCL 500 MG PO TABS
500.0000 mg | ORAL_TABLET | Freq: Two times a day (BID) | ORAL | 1 refills | Status: AC | PRN
  Filled 2024-01-03: qty 60, 30d supply, fill #0
  Filled 2024-04-06: qty 60, 30d supply, fill #1

## 2024-01-03 MED ORDER — ROSUVASTATIN CALCIUM 10 MG PO TABS
10.0000 mg | ORAL_TABLET | Freq: Every day | ORAL | 1 refills | Status: AC
  Filled 2024-01-03 – 2024-01-11 (×2): qty 90, 90d supply, fill #0
  Filled 2024-04-06: qty 90, 90d supply, fill #1

## 2024-01-03 MED ORDER — MELOXICAM 15 MG PO TABS
15.0000 mg | ORAL_TABLET | Freq: Every day | ORAL | 1 refills | Status: AC | PRN
  Filled 2024-01-03: qty 30, 30d supply, fill #0
  Filled 2024-04-06: qty 30, 30d supply, fill #1

## 2024-01-04 ENCOUNTER — Other Ambulatory Visit: Payer: Self-pay

## 2024-01-04 ENCOUNTER — Other Ambulatory Visit (HOSPITAL_COMMUNITY): Payer: Self-pay

## 2024-01-04 MED ORDER — VALSARTAN 80 MG PO TABS
80.0000 mg | ORAL_TABLET | Freq: Every day | ORAL | 0 refills | Status: DC
  Filled 2024-01-04: qty 90, 90d supply, fill #0

## 2024-01-10 ENCOUNTER — Other Ambulatory Visit (HOSPITAL_COMMUNITY): Payer: Self-pay

## 2024-01-11 ENCOUNTER — Other Ambulatory Visit: Payer: Self-pay

## 2024-01-11 ENCOUNTER — Other Ambulatory Visit (HOSPITAL_COMMUNITY): Payer: Self-pay

## 2024-01-11 ENCOUNTER — Other Ambulatory Visit (HOSPITAL_BASED_OUTPATIENT_CLINIC_OR_DEPARTMENT_OTHER): Payer: Self-pay

## 2024-01-12 ENCOUNTER — Other Ambulatory Visit (HOSPITAL_COMMUNITY): Payer: Self-pay

## 2024-01-14 ENCOUNTER — Other Ambulatory Visit (HOSPITAL_COMMUNITY): Payer: Self-pay

## 2024-02-06 ENCOUNTER — Other Ambulatory Visit (HOSPITAL_COMMUNITY): Payer: Self-pay

## 2024-02-07 ENCOUNTER — Other Ambulatory Visit (HOSPITAL_COMMUNITY): Payer: Self-pay

## 2024-02-07 MED ORDER — OZEMPIC (2 MG/DOSE) 8 MG/3ML ~~LOC~~ SOPN
2.0000 mg | PEN_INJECTOR | SUBCUTANEOUS | 5 refills | Status: AC
  Filled 2024-02-07: qty 3, 28d supply, fill #0
  Filled 2024-03-20: qty 3, 28d supply, fill #1

## 2024-02-08 ENCOUNTER — Other Ambulatory Visit (HOSPITAL_COMMUNITY): Payer: Self-pay

## 2024-02-08 ENCOUNTER — Other Ambulatory Visit: Payer: Self-pay

## 2024-03-12 NOTE — Progress Notes (Unsigned)
 "  NEUROLOGY FOLLOW UP OFFICE NOTE  Jaime Rhodes [id number]  Assessment/Plan:   Migraine without aura, without status migrainosus, not intractable Vestibular migraine, stable     Migraine prevention:  She will first try lifestyle modification and vitamins/supplements (MigreLief).  If no improvement in 3 months, will restart topiramate . Headache rescue: Excedrin for mild headaches; sumatriptan  for severe migranes Limit use of pain relievers to no more than 9 days out of the month to prevent risk of rebound or medication-overuse headache. Keep headache diary Lifestyle modification Follow up 6 months.     Subjective:  Jaime Rhodes is a 48 year old right-handed female with HTN who follows up for vestibular migraine   UPDATE: Only 2 migraines since last visit, requiring the sumatriptan  (aborted within a couple of hours)  Mild headaches (across forehead) are occurring 4 to 5 a month (2 a month associated with epistaxis - worse with the heat on in the house).  Treats with Excedrin and resolves in a couple of hours.  She notes that she is straining to see things, so she needs to get a repeat eye exam.  No vestibular migraines  Current acute medication: Excedrin first line, sumatriptan  100mg  second line  Caffeine:  1 to 2 lattes a week Hydration:  32 oz water daily.  Needs to increase. Diet:  bagel for breakfast, out to eat for lunch, low salt dinner (grilled or baked).  No soda.   Exercise:  No Sleep hygiene:  good.  8 to 9 hours of sleep a night.  Rested.    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  She had an episode of syncope at work on 05/08/2020.  She was standing at her work station when she bent down to pick something up and when she stood up, she lost consciousness. She fell hitting her head and right shoulder on a dehumidifier.  She reports previous episode of syncope but did not have any warning before she passed out this  time.  No convulsions, tongue biting, incontinence or postictal confusion..  She had not eaten since the previous afternoon.  CT head personally reviewed was negative for acute intracranial abnormality.  She was diagnosed in the ED with dehydration.  She saw outpatient cardiology and has an echocardiogram pending.     She was out of work for the first 3 weeks after the concussion.  She now is working on [employer] duty.  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  She works shift at [employer].  We tried to get her on a regular day shift, but it was denied.     Since the fall, she has had daily headaches and dizziness.  Headaches are diffuse and bilateral retro-orbital pulsations associated with seeing floaters and nausea.  They are sometimes preceded by feeling lightheaded.  She has a constant dull headache but severe headaches occur once a week and last 1/2 day to 1 day.  Moderate headaches occur 3-4 times a week and last about an hour with Tylenol .  She has history of migraines in her teens-20s.  She gets vertigo/spinning sensation when she has a severe migraine.  However, she has brief vertigo when she turns her head to the left.  Associated symptoms include nausea, diarrhea and sometimes preceded by nose bleed.  Following the fall, she had postconcussion syndrome presenting as cognitive delay and dizziness, which subsequently resolved.  Routine awake and asleep EEG on 08/21/2020 was normal.   Past  NSAIDs/analgesics:  Naproxen , ibuprofen  Past triptan:  rizatriptan  Past antiemetic: Zofran  Past antihypertensive:  metoprolol  Past antiepileptic:  topiramate  75mg  at bedtime (effective)  PAST MEDICAL HISTORY: Past Medical History:  Diagnosis Date   Acne rosacea    GERD (gastroesophageal reflux disease)    treated prn with nexium-will take dos   Herpes labialis    Hypertension    T2DM (type 2 diabetes mellitus) (HCC)     MEDICATIONS: Current Outpatient Medications on File Prior to Visit  Medication Sig Dispense  Refill   Blood Glucose Monitoring Suppl (FREESTYLE LITE) w/Device KIT Use as directed 1 kit 0   glucose blood (FREESTYLE LITE) test strip Use to check sugars once daily as directed. 100 each 0   hydrochlorothiazide  (HYDRODIURIL ) 12.5 MG tablet Take 1 tablet (12.5 mg total) by mouth daily as needed. 30 tablet 5   Lancets (FREESTYLE) lancets Use to check sugars once daily as directed. 100 each 0   meloxicam  (MOBIC ) 15 MG tablet Take 1 tablet (15 mg total) by mouth with food daily as needed for pain. 30 tablet 1   naproxen  (NAPROSYN ) 500 MG tablet Take 1 tablet (500 mg total) by mouth every 12 (twelve) hours as needed. 16 tablet 5   pantoprazole  (PROTONIX ) 40 MG tablet Take 1 tablet (40 mg total) by mouth daily. 90 tablet 1   rosuvastatin  (CRESTOR ) 10 MG tablet Take 1 tablet (10 mg total) by mouth at bedtime. 90 tablet 1   Semaglutide , 1 MG/DOSE, (OZEMPIC , 1 MG/DOSE,) 4 MG/3ML SOPN Inject 1 mg into the skin once a week, as directed. 9 mL 0   Semaglutide , 2 MG/DOSE, (OZEMPIC , 2 MG/DOSE,) 8 MG/3ML SOPN Inject 2 mg into the skin once a week. 3 mL 5   SUMAtriptan  (IMITREX ) 100 MG tablet Take 1 tablet (100 mg total) by mouth as needed for migraine. May repeat in 2 hours if headache persists or recurs.  Maximum 2 tablets in 24 hours. 10 tablet 5   topiramate  (TOPAMAX ) 25 MG tablet Take 3 tablets (75 mg total) by mouth at bedtime. (Patient not taking: Reported on 08/18/2023) 90 tablet 5   valACYclovir  (VALTREX ) 500 MG tablet Take 1 tablet (500 mg total) by mouth every 12 (twelve) hours as needed for fever blisters 60 tablet 0   valACYclovir  (VALTREX ) 500 MG tablet Take 1 tablet (500 mg total) by mouth every 12 (twelve) hours as needed for fever blisters. 60 tablet 1   valsartan  (DIOVAN ) 80 MG tablet Take 1 tablet (80 mg total) by mouth daily. 90 tablet 0   No current facility-administered medications on file prior to visit.    ALLERGIES: Allergies  Allergen Reactions   Metformin  And Related     Semaglutide      Rybelsus    Influenza Virus Vaccine Rash   Penicillins Rash    FAMILY HISTORY: Family History  Problem Relation Age of Onset   Cancer - Prostate Father    Heart attack Sister    Heart disease Sister    Stroke Sister    Stroke Maternal Grandmother       Objective:  Blood pressure 129/85, pulse 99, height 5' 6 (1.676 m), weight 182 lb 6.4 oz (82.7 kg), last menstrual period 01/23/2012, SpO2 98%.  General: No acute distress.  Patient appears well-groomed.       Juliene Dunnings, DO  CC: Jerrell Butt, PA-C       "

## 2024-03-13 ENCOUNTER — Ambulatory Visit: Admitting: Neurology

## 2024-03-13 ENCOUNTER — Encounter: Payer: Self-pay | Admitting: Neurology

## 2024-03-13 VITALS — BP 129/85 | HR 99 | Ht 66.0 in | Wt 182.4 lb

## 2024-03-13 DIAGNOSIS — G43009 Migraine without aura, not intractable, without status migrainosus: Secondary | ICD-10-CM | POA: Diagnosis not present

## 2024-03-13 NOTE — Patient Instructions (Addendum)
" °  Start MigreLief twice daily.  If no improvement in 3 months, contact me and we can restart topiramate . Take sumatriptan  at earliest onset of headache.  May repeat dose once in 2 hours if needed.  Maximum 2 tablets in 24 hours.  Take Excedrin for the more mild headaches. Limit use of pain relievers to no more than 9 days out of the month.  These medications include acetaminophen , NSAIDs (ibuprofen /Advil /Motrin , naproxen /Aleve , triptans (Imitrex /sumatriptan ), Excedrin, and narcotics.  This will help reduce risk of rebound headaches. Be aware of common food triggers: Routine exercise Stay adequately hydrated (aim for 64 oz water daily) Keep headache diary Maintain proper stress management Maintain proper sleep hygiene Do not skip meals  "

## 2024-03-20 ENCOUNTER — Other Ambulatory Visit (HOSPITAL_COMMUNITY): Payer: Self-pay

## 2024-04-06 ENCOUNTER — Other Ambulatory Visit: Payer: Self-pay

## 2024-04-06 ENCOUNTER — Other Ambulatory Visit (HOSPITAL_COMMUNITY): Payer: Self-pay

## 2024-04-06 MED ORDER — VALSARTAN 80 MG PO TABS
80.0000 mg | ORAL_TABLET | Freq: Every day | ORAL | 0 refills | Status: AC
  Filled 2024-04-06: qty 90, 90d supply, fill #0

## 2024-04-09 ENCOUNTER — Other Ambulatory Visit: Payer: Self-pay | Admitting: Neurology

## 2024-04-10 ENCOUNTER — Other Ambulatory Visit: Payer: Self-pay

## 2024-04-10 ENCOUNTER — Other Ambulatory Visit (HOSPITAL_COMMUNITY): Payer: Self-pay

## 2024-04-10 MED ORDER — SUMATRIPTAN SUCCINATE 100 MG PO TABS
100.0000 mg | ORAL_TABLET | ORAL | 5 refills | Status: AC | PRN
  Filled 2024-04-10: qty 9, 30d supply, fill #0
